# Patient Record
Sex: Female | Born: 1980 | Race: White | Hispanic: No | Marital: Single | Smoking: Current every day smoker
Health system: Southern US, Community
[De-identification: ages and names within clinical notes are randomized; demographics above are authoritative.]

## PROBLEM LIST (undated history)

## (undated) DIAGNOSIS — G709 Myoneural disorder, unspecified: Secondary | ICD-10-CM

## (undated) DIAGNOSIS — F329 Major depressive disorder, single episode, unspecified: Secondary | ICD-10-CM

## (undated) DIAGNOSIS — M1990 Unspecified osteoarthritis, unspecified site: Secondary | ICD-10-CM

## (undated) DIAGNOSIS — R51 Headache: Secondary | ICD-10-CM

## (undated) DIAGNOSIS — J45909 Unspecified asthma, uncomplicated: Secondary | ICD-10-CM

## (undated) DIAGNOSIS — D649 Anemia, unspecified: Secondary | ICD-10-CM

## (undated) DIAGNOSIS — F32A Depression, unspecified: Secondary | ICD-10-CM

## (undated) DIAGNOSIS — F99 Mental disorder, not otherwise specified: Secondary | ICD-10-CM

## (undated) DIAGNOSIS — F319 Bipolar disorder, unspecified: Secondary | ICD-10-CM

## (undated) DIAGNOSIS — K219 Gastro-esophageal reflux disease without esophagitis: Secondary | ICD-10-CM

## (undated) HISTORY — DX: Unspecified osteoarthritis, unspecified site: M19.90

## (undated) HISTORY — DX: Myoneural disorder, unspecified: G70.9

## (undated) HISTORY — DX: Major depressive disorder, single episode, unspecified: F32.9

## (undated) HISTORY — PX: BILATERAL CARPAL TUNNEL RELEASE: SHX6508

## (undated) HISTORY — DX: Depression, unspecified: F32.A

## (undated) HISTORY — PX: TONSILLECTOMY: SHX5217

## (undated) HISTORY — PX: DILATION AND CURETTAGE OF UTERUS: SHX78

## (undated) HISTORY — DX: Gastro-esophageal reflux disease without esophagitis: K21.9

---

## 2004-10-07 ENCOUNTER — Inpatient Hospital Stay: Payer: Self-pay

## 2004-11-04 ENCOUNTER — Ambulatory Visit: Payer: Self-pay | Admitting: Family Medicine

## 2005-02-02 ENCOUNTER — Ambulatory Visit: Payer: Self-pay | Admitting: Unknown Physician Specialty

## 2005-02-22 ENCOUNTER — Ambulatory Visit: Payer: Self-pay | Admitting: Family Medicine

## 2005-05-27 ENCOUNTER — Emergency Department (HOSPITAL_COMMUNITY): Admission: EM | Admit: 2005-05-27 | Discharge: 2005-05-28 | Payer: Self-pay | Admitting: Emergency Medicine

## 2005-06-19 ENCOUNTER — Ambulatory Visit: Payer: Self-pay | Admitting: Pain Medicine

## 2005-06-21 ENCOUNTER — Ambulatory Visit: Payer: Self-pay | Admitting: Family Medicine

## 2005-07-10 ENCOUNTER — Ambulatory Visit: Payer: Self-pay | Admitting: Pain Medicine

## 2005-07-11 ENCOUNTER — Ambulatory Visit: Payer: Self-pay | Admitting: Pain Medicine

## 2005-07-12 ENCOUNTER — Ambulatory Visit: Payer: Self-pay | Admitting: Pain Medicine

## 2005-07-26 ENCOUNTER — Ambulatory Visit: Payer: Self-pay | Admitting: Physician Assistant

## 2005-08-01 ENCOUNTER — Ambulatory Visit: Payer: Self-pay | Admitting: Pain Medicine

## 2005-08-14 ENCOUNTER — Ambulatory Visit: Payer: Self-pay | Admitting: Physician Assistant

## 2005-08-17 ENCOUNTER — Ambulatory Visit: Payer: Self-pay | Admitting: Pain Medicine

## 2005-08-29 ENCOUNTER — Ambulatory Visit: Payer: Self-pay | Admitting: Pain Medicine

## 2005-09-29 ENCOUNTER — Ambulatory Visit: Payer: Self-pay | Admitting: Physician Assistant

## 2005-10-30 ENCOUNTER — Ambulatory Visit: Payer: Self-pay | Admitting: Physician Assistant

## 2005-12-04 ENCOUNTER — Ambulatory Visit: Payer: Self-pay | Admitting: Physician Assistant

## 2005-12-19 ENCOUNTER — Ambulatory Visit: Payer: Self-pay | Admitting: Physician Assistant

## 2005-12-26 ENCOUNTER — Ambulatory Visit: Payer: Self-pay | Admitting: Pain Medicine

## 2006-01-12 ENCOUNTER — Ambulatory Visit: Payer: Self-pay | Admitting: Physician Assistant

## 2006-01-25 ENCOUNTER — Emergency Department: Payer: Self-pay | Admitting: Emergency Medicine

## 2006-01-30 ENCOUNTER — Ambulatory Visit: Payer: Self-pay | Admitting: Family Medicine

## 2006-03-01 ENCOUNTER — Ambulatory Visit: Payer: Self-pay | Admitting: Pain Medicine

## 2006-03-13 ENCOUNTER — Ambulatory Visit: Payer: Self-pay | Admitting: Pain Medicine

## 2006-03-15 ENCOUNTER — Ambulatory Visit: Payer: Self-pay | Admitting: Pain Medicine

## 2006-03-19 ENCOUNTER — Ambulatory Visit: Payer: Self-pay | Admitting: Pain Medicine

## 2006-03-29 ENCOUNTER — Ambulatory Visit: Payer: Self-pay | Admitting: Urology

## 2006-05-21 ENCOUNTER — Ambulatory Visit: Payer: Self-pay | Admitting: Pain Medicine

## 2006-06-01 ENCOUNTER — Ambulatory Visit: Payer: Self-pay | Admitting: Pain Medicine

## 2006-06-15 ENCOUNTER — Ambulatory Visit: Payer: Self-pay | Admitting: Physician Assistant

## 2006-06-27 ENCOUNTER — Ambulatory Visit: Payer: Self-pay | Admitting: Pain Medicine

## 2006-07-13 ENCOUNTER — Ambulatory Visit: Payer: Self-pay | Admitting: Physician Assistant

## 2006-08-09 ENCOUNTER — Ambulatory Visit: Payer: Self-pay

## 2006-10-12 ENCOUNTER — Ambulatory Visit: Payer: Self-pay | Admitting: Physician Assistant

## 2007-02-14 ENCOUNTER — Emergency Department: Payer: Self-pay | Admitting: Emergency Medicine

## 2007-02-17 ENCOUNTER — Emergency Department: Payer: Self-pay | Admitting: Unknown Physician Specialty

## 2007-11-10 ENCOUNTER — Inpatient Hospital Stay: Payer: Self-pay | Admitting: Obstetrics & Gynecology

## 2008-01-25 ENCOUNTER — Emergency Department: Payer: Self-pay | Admitting: Emergency Medicine

## 2008-12-02 ENCOUNTER — Inpatient Hospital Stay: Payer: Self-pay | Admitting: Psychiatry

## 2009-01-27 ENCOUNTER — Ambulatory Visit: Payer: Self-pay

## 2009-02-24 ENCOUNTER — Ambulatory Visit: Payer: Self-pay | Admitting: Family Medicine

## 2009-02-24 LAB — CONVERTED CEMR LAB
Eosinophils Absolute: 0 10*3/uL (ref 0.0–0.7)
Eosinophils Relative: 1 % (ref 0–5)
HCT: 33.7 % — ABNORMAL LOW (ref 36.0–46.0)
Lymphs Abs: 0.6 10*3/uL — ABNORMAL LOW (ref 0.7–4.0)
MCV: 79.1 fL (ref 78.0–100.0)
Monocytes Relative: 5 % (ref 3–12)
Neutrophils Relative %: 78 % — ABNORMAL HIGH (ref 43–77)
RBC: 4.26 M/uL (ref 3.87–5.11)
Rubella: 116.3 intl units/mL — ABNORMAL HIGH
WBC: 3.6 10*3/uL — ABNORMAL LOW (ref 4.0–10.5)

## 2009-02-25 ENCOUNTER — Ambulatory Visit: Payer: Self-pay | Admitting: Obstetrics & Gynecology

## 2009-02-25 ENCOUNTER — Other Ambulatory Visit: Admission: RE | Admit: 2009-02-25 | Discharge: 2009-02-25 | Payer: Self-pay | Admitting: Obstetrics & Gynecology

## 2009-02-25 ENCOUNTER — Encounter: Payer: Self-pay | Admitting: Obstetrics & Gynecology

## 2009-02-26 ENCOUNTER — Ambulatory Visit (HOSPITAL_COMMUNITY): Admission: RE | Admit: 2009-02-26 | Discharge: 2009-02-26 | Payer: Self-pay | Admitting: Family Medicine

## 2009-03-10 ENCOUNTER — Inpatient Hospital Stay (HOSPITAL_COMMUNITY): Admission: AD | Admit: 2009-03-10 | Discharge: 2009-03-11 | Payer: Self-pay | Admitting: Obstetrics & Gynecology

## 2009-03-10 ENCOUNTER — Ambulatory Visit: Payer: Self-pay | Admitting: Physician Assistant

## 2009-03-10 ENCOUNTER — Ambulatory Visit: Payer: Self-pay | Admitting: Obstetrics & Gynecology

## 2009-03-11 ENCOUNTER — Encounter: Payer: Self-pay | Admitting: Family Medicine

## 2009-04-08 ENCOUNTER — Ambulatory Visit: Payer: Self-pay | Admitting: Obstetrics & Gynecology

## 2009-07-03 HISTORY — PX: LAPAROSCOPY: SHX197

## 2009-09-28 ENCOUNTER — Emergency Department: Payer: Self-pay | Admitting: Emergency Medicine

## 2009-11-15 ENCOUNTER — Emergency Department: Payer: Self-pay | Admitting: Emergency Medicine

## 2010-02-20 ENCOUNTER — Emergency Department: Payer: Self-pay | Admitting: Internal Medicine

## 2010-03-08 ENCOUNTER — Emergency Department: Payer: Self-pay | Admitting: Emergency Medicine

## 2010-03-24 ENCOUNTER — Ambulatory Visit: Payer: Self-pay | Admitting: Orthopedic Surgery

## 2010-05-05 ENCOUNTER — Ambulatory Visit: Payer: Self-pay | Admitting: Orthopedic Surgery

## 2010-07-25 ENCOUNTER — Encounter: Payer: Self-pay | Admitting: Obstetrics & Gynecology

## 2010-08-24 ENCOUNTER — Emergency Department: Payer: Self-pay | Admitting: Emergency Medicine

## 2010-10-07 LAB — CBC
MCHC: 34.6 g/dL (ref 30.0–36.0)
RBC: 3.43 MIL/uL — ABNORMAL LOW (ref 3.87–5.11)

## 2010-10-07 LAB — TYPE AND SCREEN
ABO/RH(D): O POS
Antibody Screen: NEGATIVE

## 2010-10-30 IMAGING — US US FETAL NUCHAL TRANS ADDL GEST
1 series · 18 of 28 positions shown · non-contrast
Comparison: none

OBSTETRICAL ULTRASOUND:
 This ultrasound was performed in The [HOSPITAL], and the [facility] report will be stored to [REDACTED] PACS.

[Series 1: us fetal nuchal trans addl gest · 18 of 42 slices shown]
[im 1/42]
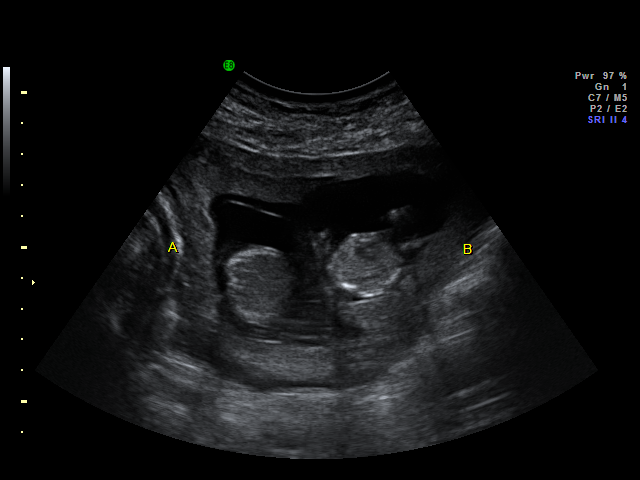
[im 4/42]
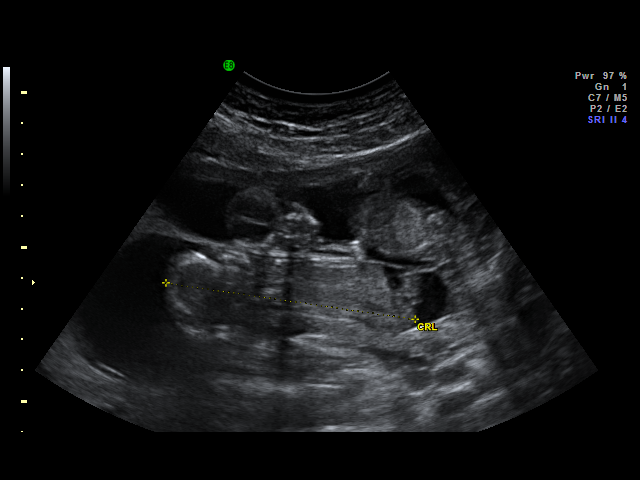
[im 5/42]
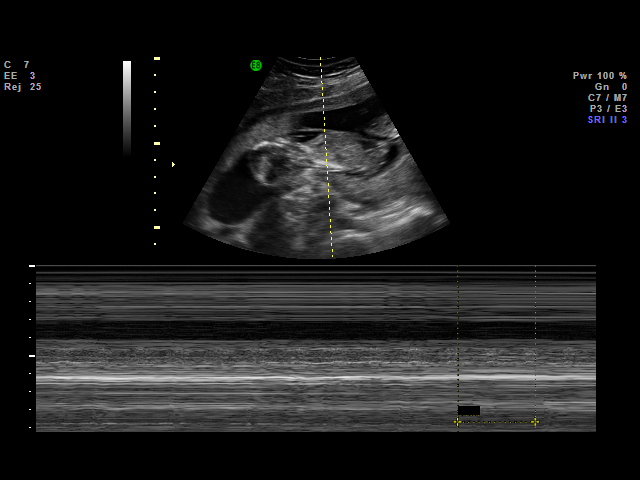
[im 8/42]
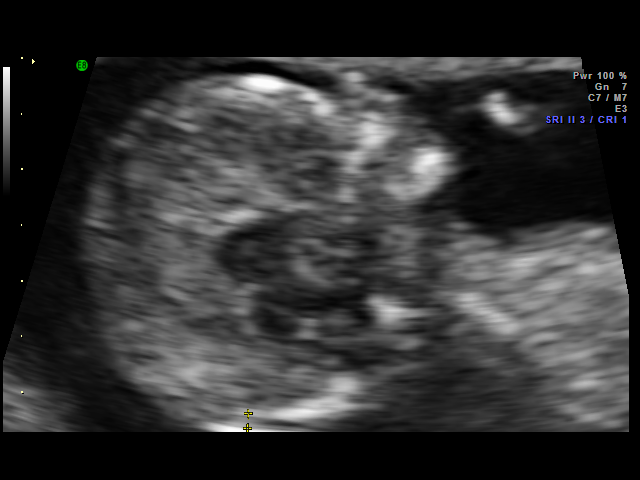
[im 11/42]
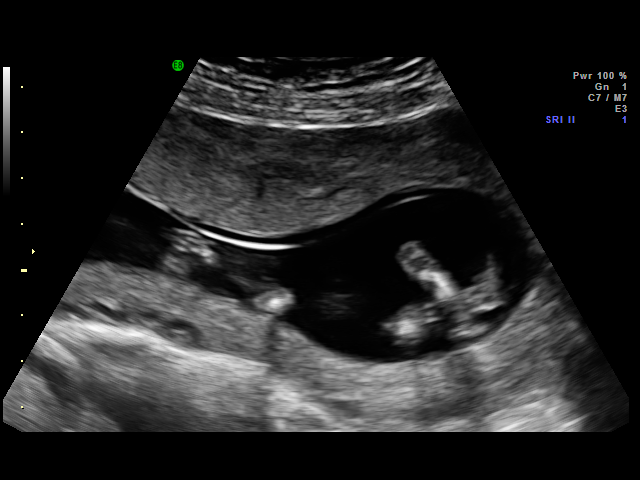
[im 13/42]
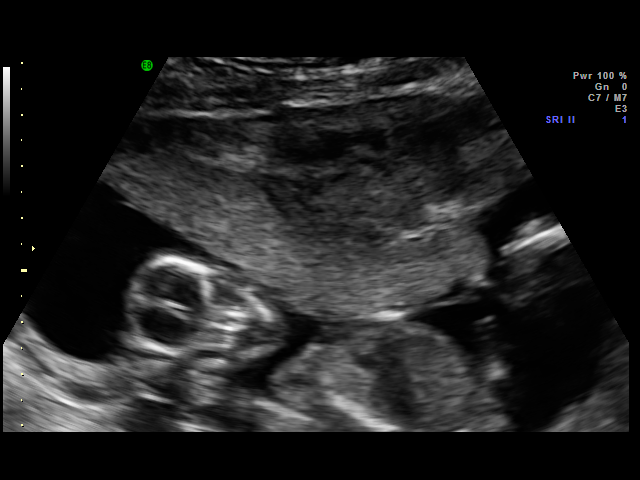
[im 16/42]
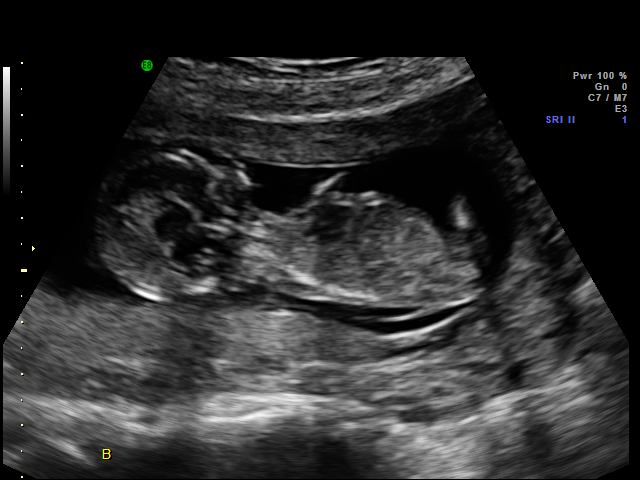
[im 17/42]
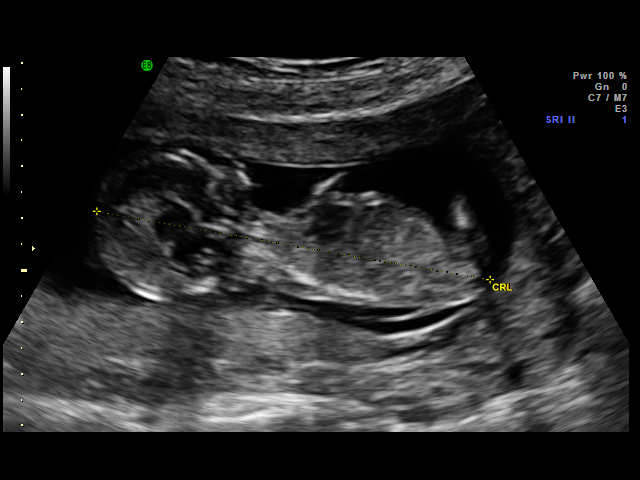
[im 20/42]
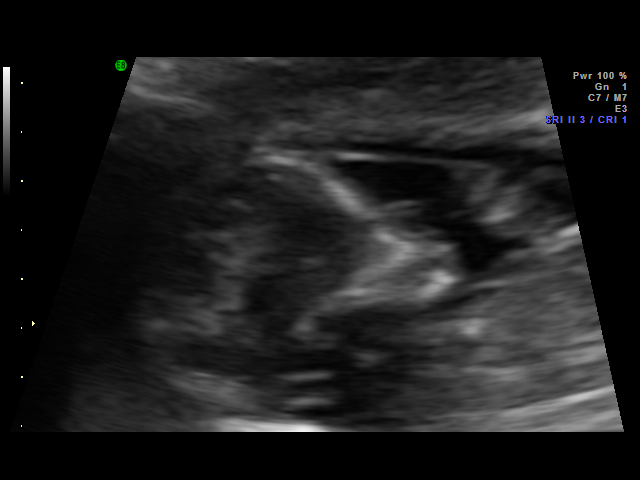
[im 22/42]
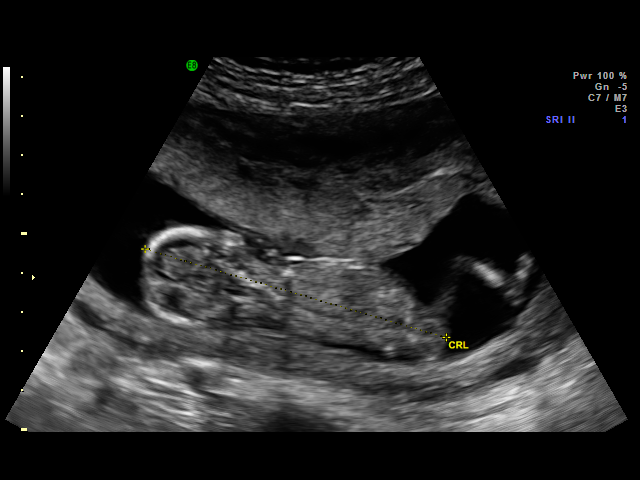
[im 25/42]
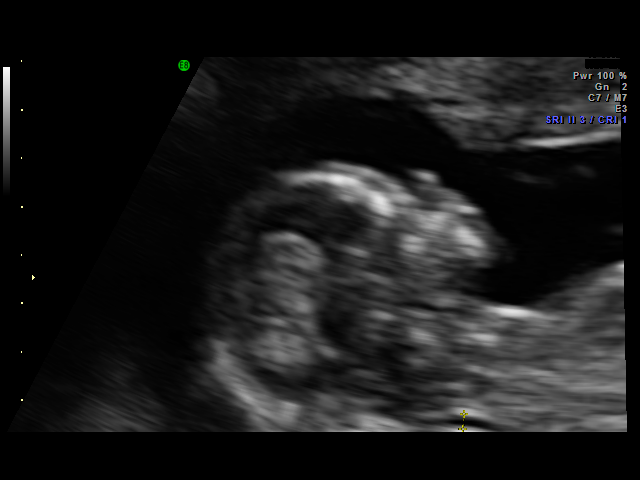
[im 26/42]
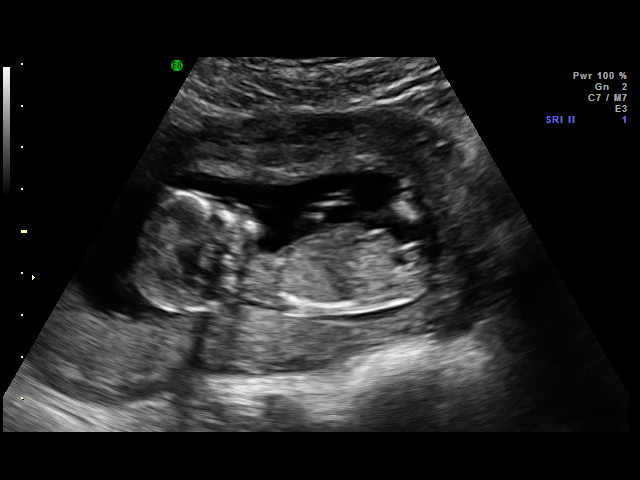
[im 29/42]
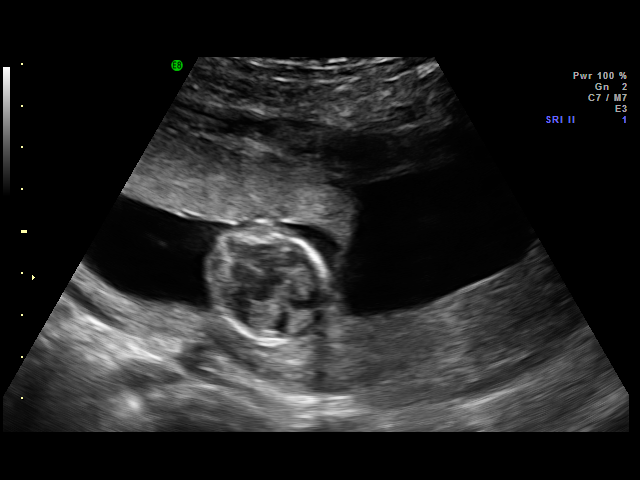
[im 32/42]
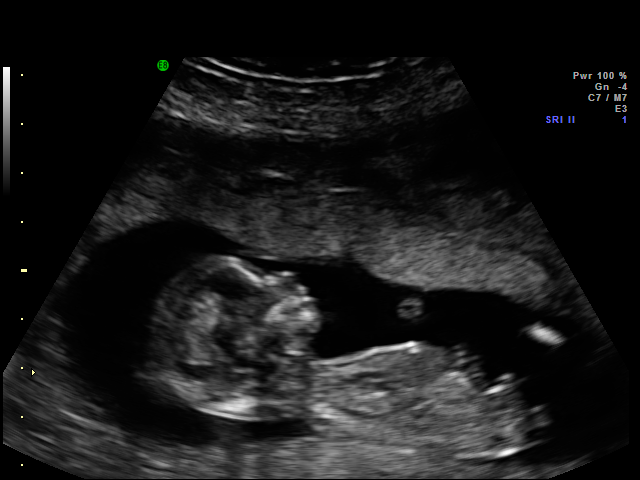
[im 34/42]
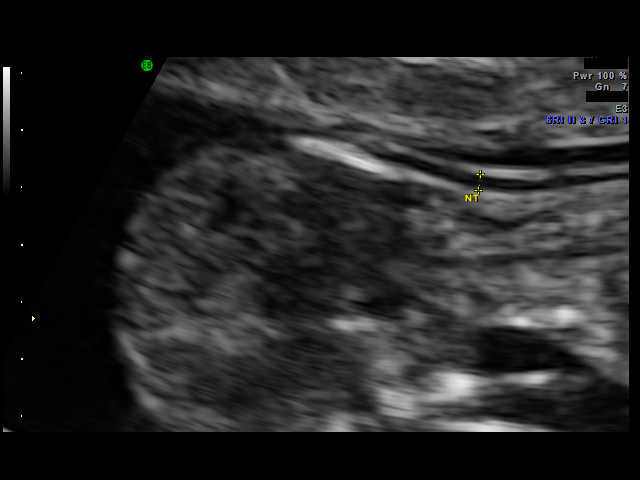
[im 37/42]
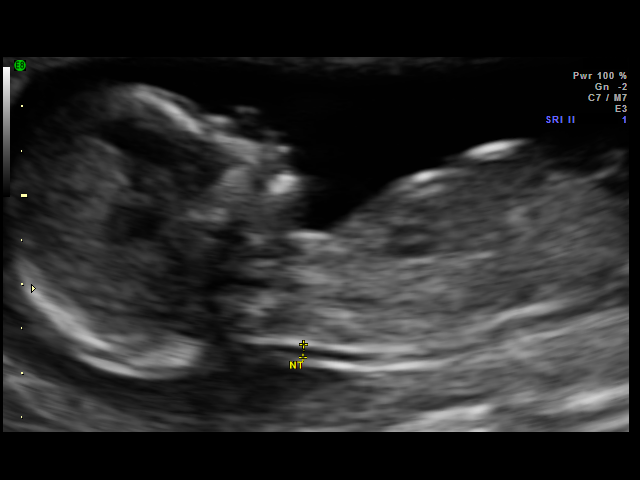
[im 38/42]
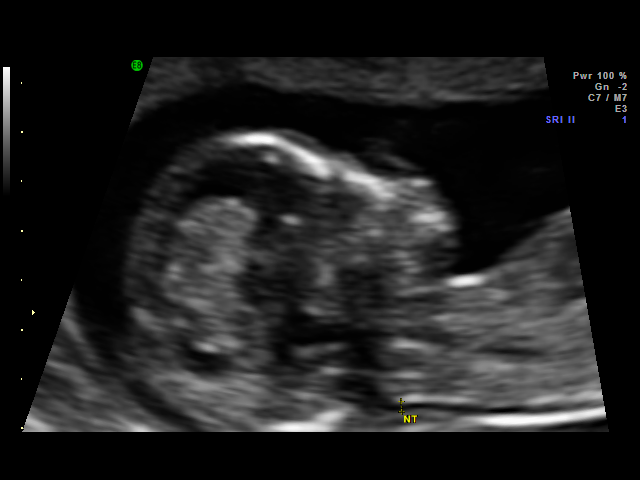
[im 42/42]
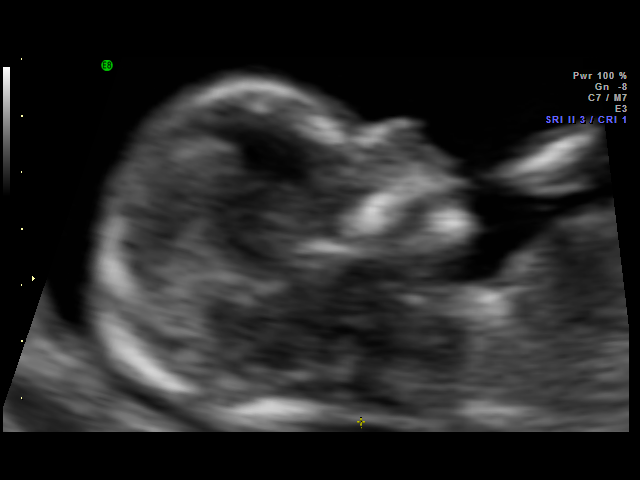

[18 of 28 positions shown; findings below may reference images not displayed]

IMPRESSION: [facility] has also been faxed to the ordering physician.

## 2010-11-15 NOTE — Assessment & Plan Note (Signed)
Crystal [name], Crystal [name]                ACCOUNT NO.:  [id number]   MEDICAL RECORD NO.:  [id number]          PATIENT TYPE:  POB   LOCATION:  [facility] at [facility]         FACILITY:  [facility]   PHYSICIAN:  Scheryl Darter, MD       DATE OF BIRTH:  15-Apr-1981   DATE OF SERVICE:                                  CLINIC NOTE   The patient returns today in followup after a second trimester  miscarriages.  The patient is a 30 year old gravida 4, para 2-0-2-2  presented at 70 weeks 4 days' gestation with twin gestation and gross  rupture of membranes on September 8.  She was admitted for management of  inevitable abortion and she received Cytotec, had spontaneous passage of  twin gestation.  Pathology showed acute chorioamnionitis.  She now just  see some slight irregular bleeding which started a few days ago.  She  would like to be on oral contraceptives for now.  She also has had some  problems with symptoms of depression and insomnia and she has been  treated for anxiety in the past.  Currently, she thinks she does not  need to start any antidepressant medication, but she is having problem  with sleep.  She has done well with Ambien CR in the past.   PAST MEDICAL HISTORY:  Genital warts and abnormal Pap.   PAST SURGICAL HISTORY:  LEEP in 2006 and C-section in 2009 and  diagnostic laparoscopy in 2008.   MEDICATIONS:  She is on no medications currently.   Physical examination; the patient is pleasant and blood pressure is  116/67.  Abdomen is soft, nontender, no mass.  Pelvic exam, external  genitalia showed 1 small condyloma on the right vulva and a very tiny 1  next to it.  Vagina and cervix notable for scant amount of blood.  Cervix appeared normal.  Uterus normal size, nontender.  Treated the 2  small warts with 80% TCA.  She wished to be on oral contraceptives and I  gave prescription for Ortho Tri-Cyclen Lo which she can start at any  time now.  For sleep, I gave her prescription for Ambien CR  12.5 mg 7  tablets 1 p.o. nightly for sleep.  She states that she may see her  primary care doctor for anxiety symptoms.  She will return for routine  care here.      Scheryl Darter, MD     JA/MEDQ  D:  04/08/2009  T:  04/09/2009  Job:  [id number]

## 2010-11-30 ENCOUNTER — Emergency Department: Payer: Self-pay | Admitting: Emergency Medicine

## 2011-04-07 ENCOUNTER — Emergency Department: Payer: Self-pay | Admitting: Internal Medicine

## 2011-04-16 ENCOUNTER — Emergency Department: Payer: Self-pay | Admitting: Emergency Medicine

## 2011-06-14 ENCOUNTER — Emergency Department (HOSPITAL_COMMUNITY)
Admission: EM | Admit: 2011-06-14 | Discharge: 2011-06-14 | Disposition: A | Discharge status: Home / Self Care | Payer: Medicaid Other | Attending: Emergency Medicine | Admitting: Emergency Medicine

## 2011-06-14 DIAGNOSIS — Z760 Encounter for issue of repeat prescription: Secondary | ICD-10-CM | POA: Insufficient documentation

## 2011-06-14 DIAGNOSIS — IMO0001 Reserved for inherently not codable concepts without codable children: Secondary | ICD-10-CM | POA: Insufficient documentation

## 2011-06-14 MED ORDER — METHADONE HCL 10 MG PO TABS
100.0000 mg | ORAL_TABLET | Freq: Once | ORAL | Status: AC
  Administered 2011-06-14: 100 mg via ORAL

## 2011-06-14 NOTE — ED Provider Notes (Addendum)
History     CSN: [id number] Arrival date & time: 06/14/2011 10:44 AM   First MD Initiated Contact with Patient 06/14/11 1132      Chief Complaint  Patient presents with  . Muscle Pain    (Consider location/radiation/quality/duration/timing/severity/associated sxs/prior treatment) HPI Patient w chronic methadone use presents due to an inability to go to her clinic to receive meds today. No past medical history on file.  No past surgical history on file.  No family history on file.  History  Substance Use Topics  . Smoking status: Not on file  . Smokeless tobacco: Not on file  . Alcohol Use: Not on file    OB History    Grav Para Term Preterm Abortions TAB SAB Ect Mult Living                  Review of Systems  All other systems reviewed and are negative.    Allergies  Review of patient's allergies indicates no known allergies.  Home Medications  No current outpatient prescriptions on file.  BP 128/62  Pulse 58  Temp(Src) 97.8 F (36.6 C) (Oral)  Resp 20  Ht 5\' 7"  (1.702 m)  Wt 125 lb (56.7 kg)  BMI 19.58 kg/m2  SpO2 99%  Physical Exam  Constitutional: She is oriented to person, place, and time. She appears well-developed and well-nourished. No distress.  HENT:  Head: Normocephalic and atraumatic.  Eyes: Conjunctivae and EOM are normal.  Cardiovascular: Normal rate and regular rhythm.   Pulmonary/Chest: Effort normal and breath sounds normal. No stridor. No respiratory distress.  Neurological: She is alert and oriented to person, place, and time. No cranial nerve deficit. She exhibits normal muscle tone. Coordination normal.  Skin: Skin is warm and dry.  Psychiatric: She has a normal mood and affect.    ED Course  Procedures (including critical care time)  Labs Reviewed - No data to display No results found.   No diagnosis found.    MDM  Patient w chronic methadone use presents due to an inability to go to her clinic to receive meds  today.  On exam she is no distress, and denies any new complaints.  The patient's dose was confirmed w RN at [facility] .  Patient received her methadone, and was d/c.        Gerhard Munch, MD 06/14/11 1322  Gerhard Munch, MD 09/14/11 [phone]

## 2011-06-14 NOTE — ED Notes (Signed)
Pt states that her ride was late so she missed the [facility] clinic this am states needs 100 mg

## 2011-06-14 NOTE — ED Notes (Signed)
Pt. Needs her methodone

## 2011-06-18 ENCOUNTER — Emergency Department: Payer: Self-pay | Admitting: Unknown Physician Specialty

## 2011-10-27 ENCOUNTER — Inpatient Hospital Stay: Payer: Self-pay | Admitting: Internal Medicine

## 2011-10-27 LAB — URINALYSIS, COMPLETE
Glucose,UR: NEGATIVE mg/dL (ref 0–75)
Ketone: NEGATIVE
Nitrite: POSITIVE
Protein: NEGATIVE
Specific Gravity: 1.009 (ref 1.003–1.030)
Squamous Epithelial: 1

## 2011-10-27 LAB — CBC
HCT: 41.8 % (ref 35.0–47.0)
HGB: 14 g/dL (ref 12.0–16.0)
Platelet: 318 10*3/uL (ref 150–440)
RDW: 12.8 % (ref 11.5–14.5)
WBC: 4.6 10*3/uL (ref 3.6–11.0)

## 2011-10-27 LAB — ACETAMINOPHEN LEVEL: Acetaminophen: <2 ug/mL

## 2011-10-27 LAB — COMPREHENSIVE METABOLIC PANEL
Alkaline Phosphatase: 85 U/L (ref 50–136)
Calcium, Total: 8.7 mg/dL (ref 8.5–10.1)
Chloride: 107 mmol/L (ref 98–107)
Co2: 27 mmol/L (ref 21–32)
Creatinine: 0.71 mg/dL (ref 0.60–1.30)
EGFR (African American): >60
Glucose: 98 mg/dL (ref 65–99)
Osmolality: 285 (ref 275–301)
SGOT(AST): 258 U/L — ABNORMAL HIGH (ref 15–37)
SGPT (ALT): 172 U/L — ABNORMAL HIGH
Total Protein: 7.3 g/dL (ref 6.4–8.2)

## 2011-10-27 LAB — ETHANOL: Ethanol: <3 mg/dL

## 2011-10-27 LAB — DRUG SCREEN, URINE
Amphetamines, Ur Screen: POSITIVE (ref ?–1000)
Barbiturates, Ur Screen: NEGATIVE (ref ?–200)
Phencyclidine (PCP) Ur S: NEGATIVE (ref ?–25)

## 2011-10-27 LAB — SALICYLATE LEVEL: Salicylates, Serum: <1.7 mg/dL

## 2011-10-27 LAB — TROPONIN I: Troponin-I: <0.02 ng/mL

## 2011-10-28 LAB — CBC WITH DIFFERENTIAL/PLATELET
Basophil #: 0 10*3/uL (ref 0.0–0.1)
Eosinophil #: 0 10*3/uL (ref 0.0–0.7)
Eosinophil %: 0 %
HCT: 39.6 % (ref 35.0–47.0)
HGB: 13 g/dL (ref 12.0–16.0)
Lymphocyte #: 0.9 10*3/uL — ABNORMAL LOW (ref 1.0–3.6)
Lymphocyte %: 10.6 %
MCHC: 32.9 g/dL (ref 32.0–36.0)
MCV: 84 fL (ref 80–100)
RDW: 13.1 % (ref 11.5–14.5)

## 2011-10-28 LAB — HEPATIC FUNCTION PANEL A (ARMC)
Alkaline Phosphatase: 76 U/L (ref 50–136)
Bilirubin, Direct: 0.1 mg/dL (ref 0.00–0.20)
Bilirubin,Total: 0.4 mg/dL (ref 0.2–1.0)
Total Protein: 6.8 g/dL (ref 6.4–8.2)

## 2011-10-28 LAB — BASIC METABOLIC PANEL
Anion Gap: 8 (ref 7–16)
Co2: 25 mmol/L (ref 21–32)
EGFR (African American): >60
Glucose: 150 mg/dL — ABNORMAL HIGH (ref 65–99)
Osmolality: 288 (ref 275–301)
Sodium: 143 mmol/L (ref 136–145)

## 2011-10-28 LAB — RAPID HIV-1/2 QL/CONFIRM: HIV-1/2,Rapid Ql: NEGATIVE

## 2011-10-29 LAB — COMPREHENSIVE METABOLIC PANEL
Alkaline Phosphatase: 65 U/L (ref 50–136)
Anion Gap: 4 — ABNORMAL LOW (ref 7–16)
Bilirubin,Total: 0.4 mg/dL (ref 0.2–1.0)
Chloride: 108 mmol/L — ABNORMAL HIGH (ref 98–107)
Co2: 30 mmol/L (ref 21–32)
EGFR (Non-African Amer.): >60
Glucose: 138 mg/dL — ABNORMAL HIGH (ref 65–99)
Osmolality: 285 (ref 275–301)
Potassium: 4.4 mmol/L (ref 3.5–5.1)
SGOT(AST): 59 U/L — ABNORMAL HIGH (ref 15–37)
Sodium: 142 mmol/L (ref 136–145)

## 2011-10-29 LAB — CBC WITH DIFFERENTIAL/PLATELET
Eosinophil %: 0 %
Lymphocyte #: 0.7 10*3/uL — ABNORMAL LOW (ref 1.0–3.6)
Lymphocyte %: 7.7 %
MCH: 27.9 pg (ref 26.0–34.0)
MCHC: 33.4 g/dL (ref 32.0–36.0)
Monocyte #: 0.2 x10 3/mm (ref 0.2–0.9)
Monocyte %: 2.6 %
Neutrophil #: 8.1 10*3/uL — ABNORMAL HIGH (ref 1.4–6.5)
RDW: 13.2 % (ref 11.5–14.5)

## 2011-10-30 LAB — COMPREHENSIVE METABOLIC PANEL
Alkaline Phosphatase: 58 U/L (ref 50–136)
BUN: 13 mg/dL (ref 7–18)
Bilirubin,Total: 0.4 mg/dL (ref 0.2–1.0)
Calcium, Total: 8.2 mg/dL — ABNORMAL LOW (ref 8.5–10.1)
Co2: 25 mmol/L (ref 21–32)
EGFR (African American): >60
EGFR (Non-African Amer.): >60
Glucose: 123 mg/dL — ABNORMAL HIGH (ref 65–99)
Osmolality: 286 (ref 275–301)
SGOT(AST): 35 U/L (ref 15–37)

## 2011-10-30 LAB — POTASSIUM: Potassium: 3.8 mmol/L (ref 3.5–5.1)

## 2011-10-30 LAB — URINE CULTURE

## 2011-11-01 LAB — COMPREHENSIVE METABOLIC PANEL
Albumin: 3.2 g/dL — ABNORMAL LOW (ref 3.4–5.0)
Alkaline Phosphatase: 70 U/L (ref 50–136)
Bilirubin,Total: 0.4 mg/dL (ref 0.2–1.0)
Chloride: 102 mmol/L (ref 98–107)
Creatinine: 0.76 mg/dL (ref 0.60–1.30)
EGFR (African American): >60
EGFR (Non-African Amer.): >60
Glucose: 90 mg/dL (ref 65–99)
Potassium: 3.6 mmol/L (ref 3.5–5.1)
Sodium: 141 mmol/L (ref 136–145)
Total Protein: 6.6 g/dL (ref 6.4–8.2)

## 2011-11-29 ENCOUNTER — Ambulatory Visit: Payer: Self-pay | Admitting: Family Medicine

## 2012-03-11 ENCOUNTER — Ambulatory Visit: Payer: Self-pay | Admitting: Family Medicine

## 2012-03-15 ENCOUNTER — Ambulatory Visit: Payer: Self-pay

## 2012-03-29 ENCOUNTER — Ambulatory Visit: Payer: Self-pay | Admitting: Family Medicine

## 2012-04-15 ENCOUNTER — Ambulatory Visit: Payer: Self-pay | Admitting: Specialist

## 2012-05-13 ENCOUNTER — Emergency Department: Payer: Self-pay | Admitting: Emergency Medicine

## 2012-05-13 LAB — CBC
HCT: 41 % (ref 35.0–47.0)
HGB: 14.1 g/dL (ref 12.0–16.0)
MCHC: 34.4 g/dL (ref 32.0–36.0)
RBC: 4.86 10*6/uL (ref 3.80–5.20)
WBC: 2.6 10*3/uL — ABNORMAL LOW (ref 3.6–11.0)

## 2012-05-13 LAB — COMPREHENSIVE METABOLIC PANEL
Alkaline Phosphatase: 98 U/L (ref 50–136)
BUN: 15 mg/dL (ref 7–18)
Bilirubin,Total: 0.3 mg/dL (ref 0.2–1.0)
EGFR (African American): >60
EGFR (Non-African Amer.): >60
Glucose: 88 mg/dL (ref 65–99)
SGOT(AST): 118 U/L — ABNORMAL HIGH (ref 15–37)
SGPT (ALT): 128 U/L — ABNORMAL HIGH (ref 12–78)

## 2012-05-13 LAB — URINALYSIS, COMPLETE
Blood: NEGATIVE
Glucose,UR: NEGATIVE mg/dL (ref 0–75)
Leukocyte Esterase: NEGATIVE
Nitrite: NEGATIVE

## 2012-05-13 LAB — PREGNANCY, URINE: Pregnancy Test, Urine: NEGATIVE m[IU]/mL

## 2012-08-15 ENCOUNTER — Emergency Department: Payer: Self-pay | Admitting: Emergency Medicine

## 2012-09-10 ENCOUNTER — Emergency Department: Payer: Self-pay | Admitting: Emergency Medicine

## 2012-09-10 LAB — COMPREHENSIVE METABOLIC PANEL
Alkaline Phosphatase: 71 U/L (ref 50–136)
BUN: 12 mg/dL (ref 7–18)
Bilirubin,Total: 0.6 mg/dL (ref 0.2–1.0)
Creatinine: 0.46 mg/dL — ABNORMAL LOW (ref 0.60–1.30)
EGFR (African American): >60
Glucose: 97 mg/dL (ref 65–99)
Osmolality: 272 (ref 275–301)
SGOT(AST): 26 U/L (ref 15–37)
Sodium: 136 mmol/L (ref 136–145)
Total Protein: 7.7 g/dL (ref 6.4–8.2)

## 2012-09-10 LAB — PREGNANCY, URINE: Pregnancy Test, Urine: POSITIVE m[IU]/mL

## 2012-09-10 LAB — CBC
MCH: 29.5 pg (ref 26.0–34.0)
MCV: 87 fL (ref 80–100)
Platelet: 367 10*3/uL (ref 150–440)
RBC: 5.4 10*6/uL — ABNORMAL HIGH (ref 3.80–5.20)
RDW: 13.5 % (ref 11.5–14.5)
WBC: 8.7 10*3/uL (ref 3.6–11.0)

## 2012-09-10 LAB — DRUG SCREEN, URINE
Amphetamines, Ur Screen: NEGATIVE (ref ?–1000)
Cannabinoid 50 Ng, Ur ~~LOC~~: NEGATIVE (ref ?–50)
Cocaine Metabolite,Ur ~~LOC~~: POSITIVE (ref ?–300)
MDMA (Ecstasy)Ur Screen: POSITIVE (ref ?–500)
Opiate, Ur Screen: NEGATIVE (ref ?–300)

## 2012-09-10 LAB — URINALYSIS, COMPLETE
Bilirubin,UR: NEGATIVE
Glucose,UR: NEGATIVE mg/dL (ref 0–75)
Ph: 5 (ref 4.5–8.0)
Protein: NEGATIVE
RBC,UR: 1 /HPF (ref 0–5)
Specific Gravity: 1.023 (ref 1.003–1.030)
Squamous Epithelial: 3
WBC UR: 4 /HPF (ref 0–5)

## 2012-09-10 LAB — ETHANOL: Ethanol %: <0.003 % (ref 0.000–0.080)

## 2012-09-11 ENCOUNTER — Encounter (HOSPITAL_COMMUNITY): Payer: Self-pay | Admitting: *Deleted

## 2012-09-11 ENCOUNTER — Inpatient Hospital Stay (HOSPITAL_COMMUNITY)
Admission: EM | Admit: 2012-09-11 | Discharge: 2012-09-18 | DRG: 897 | Disposition: A | Discharge status: Home / Self Care | Payer: Medicaid Other | Source: Other Acute Inpatient Hospital | Attending: Psychiatry | Admitting: Psychiatry

## 2012-09-11 DIAGNOSIS — F39 Unspecified mood [affective] disorder: Secondary | ICD-10-CM | POA: Diagnosis present

## 2012-09-11 DIAGNOSIS — F1910 Other psychoactive substance abuse, uncomplicated: Secondary | ICD-10-CM | POA: Diagnosis present

## 2012-09-11 DIAGNOSIS — F3177 Bipolar disorder, in partial remission, most recent episode mixed: Secondary | ICD-10-CM | POA: Diagnosis present

## 2012-09-11 DIAGNOSIS — F909 Attention-deficit hyperactivity disorder, unspecified type: Secondary | ICD-10-CM | POA: Diagnosis present

## 2012-09-11 DIAGNOSIS — Z79899 Other long term (current) drug therapy: Secondary | ICD-10-CM

## 2012-09-11 DIAGNOSIS — J45909 Unspecified asthma, uncomplicated: Secondary | ICD-10-CM | POA: Diagnosis present

## 2012-09-11 DIAGNOSIS — F1120 Opioid dependence, uncomplicated: Principal | ICD-10-CM | POA: Diagnosis present

## 2012-09-11 DIAGNOSIS — F1920 Other psychoactive substance dependence, uncomplicated: Secondary | ICD-10-CM | POA: Diagnosis present

## 2012-09-11 HISTORY — DX: Mental disorder, not otherwise specified: F99

## 2012-09-11 HISTORY — DX: Headache: R51

## 2012-09-11 HISTORY — DX: Unspecified asthma, uncomplicated: J45.909

## 2012-09-11 HISTORY — DX: Anemia, unspecified: D64.9

## 2012-09-11 MED ORDER — IBUPROFEN 800 MG PO TABS
800.0000 mg | ORAL_TABLET | Freq: Three times a day (TID) | ORAL | Status: DC | PRN
  Administered 2012-09-12: 800 mg via ORAL
  Filled 2012-09-11: qty 1

## 2012-09-11 MED ORDER — HYDROXYZINE HCL 25 MG PO TABS
25.0000 mg | ORAL_TABLET | Freq: Four times a day (QID) | ORAL | Status: AC | PRN
  Administered 2012-09-12 (×2): 25 mg via ORAL

## 2012-09-11 MED ORDER — LORAZEPAM 1 MG PO TABS
1.0000 mg | ORAL_TABLET | Freq: Two times a day (BID) | ORAL | Status: AC
  Administered 2012-09-11 (×2): 1 mg via ORAL
  Filled 2012-09-11: qty 1

## 2012-09-11 MED ORDER — VITAMIN B-1 100 MG PO TABS
100.0000 mg | ORAL_TABLET | Freq: Every day | ORAL | Status: DC
  Administered 2012-09-12 – 2012-09-18 (×7): 100 mg via ORAL
  Filled 2012-09-11 (×9): qty 1

## 2012-09-11 MED ORDER — MAGNESIUM HYDROXIDE 400 MG/5ML PO SUSP: 30.0000 mL | Freq: Every day | ORAL | Status: DC | PRN

## 2012-09-11 MED ORDER — ARIPIPRAZOLE 15 MG PO TABS
15.0000 mg | ORAL_TABLET | Freq: Every day | ORAL | Status: DC
  Administered 2012-09-11: 15 mg via ORAL
  Filled 2012-09-11 (×3): qty 1

## 2012-09-11 MED ORDER — LORAZEPAM 1 MG PO TABS
1.0000 mg | ORAL_TABLET | Freq: Three times a day (TID) | ORAL | Status: AC
  Administered 2012-09-12 (×3): 1 mg via ORAL
  Filled 2012-09-11 (×5): qty 1

## 2012-09-11 MED ORDER — GABAPENTIN 400 MG PO CAPS
800.0000 mg | ORAL_CAPSULE | Freq: Four times a day (QID) | ORAL | Status: DC
  Administered 2012-09-11 – 2012-09-18 (×27): 800 mg via ORAL
  Filled 2012-09-11 (×38): qty 2

## 2012-09-11 MED ORDER — ACETAMINOPHEN 325 MG PO TABS
650.0000 mg | ORAL_TABLET | Freq: Four times a day (QID) | ORAL | Status: DC | PRN
  Administered 2012-09-12: 650 mg via ORAL

## 2012-09-11 MED ORDER — LOPERAMIDE HCL 2 MG PO CAPS: 2.0000 mg | ORAL_CAPSULE | ORAL | Status: DC | PRN

## 2012-09-11 MED ORDER — LORAZEPAM 1 MG PO TABS
1.0000 mg | ORAL_TABLET | ORAL | Status: AC
  Administered 2012-09-11: 1 mg via ORAL

## 2012-09-11 MED ORDER — TRAZODONE HCL 100 MG PO TABS
200.0000 mg | ORAL_TABLET | Freq: Every day | ORAL | Status: DC
  Administered 2012-09-11: 200 mg via ORAL
  Filled 2012-09-11 (×3): qty 2

## 2012-09-11 MED ORDER — ALUM & MAG HYDROXIDE-SIMETH 200-200-20 MG/5ML PO SUSP
30.0000 mL | ORAL | Status: DC | PRN
  Administered 2012-09-18: 30 mL via ORAL

## 2012-09-11 MED ORDER — VITAMIN B-12 100 MCG PO TABS
50.0000 ug | ORAL_TABLET | Freq: Every day | ORAL | Status: DC
  Administered 2012-09-12: 50 ug via ORAL
  Filled 2012-09-11 (×4): qty 1

## 2012-09-11 MED ORDER — IPRATROPIUM-ALBUTEROL 18-103 MCG/ACT IN AERO
2.0000 | INHALATION_SPRAY | Freq: Four times a day (QID) | RESPIRATORY_TRACT | Status: DC | PRN
  Filled 2012-09-11: qty 14.7

## 2012-09-11 MED ORDER — THIAMINE HCL 100 MG/ML IJ SOLN
100.0000 mg | Freq: Once | INTRAMUSCULAR | Status: AC
  Administered 2012-09-11: 100 mg via INTRAMUSCULAR

## 2012-09-11 MED ORDER — NICOTINE 14 MG/24HR TD PT24
14.0000 mg | MEDICATED_PATCH | Freq: Every day | TRANSDERMAL | Status: DC
  Filled 2012-09-11: qty 1

## 2012-09-11 MED ORDER — ONDANSETRON 4 MG PO TBDP
4.0000 mg | ORAL_TABLET | Freq: Four times a day (QID) | ORAL | Status: DC | PRN
  Administered 2012-09-12 – 2012-09-14 (×3): 4 mg via ORAL

## 2012-09-11 MED ORDER — GABAPENTIN 800 MG PO TABS: 800.0000 mg | ORAL_TABLET | Freq: Four times a day (QID) | ORAL | Status: DC

## 2012-09-11 MED ORDER — NICOTINE 21 MG/24HR TD PT24
21.0000 mg | MEDICATED_PATCH | Freq: Every day | TRANSDERMAL | Status: DC
  Administered 2012-09-11: 21 mg via TRANSDERMAL
  Filled 2012-09-11: qty 1

## 2012-09-11 MED ORDER — LORAZEPAM 1 MG PO TABS
1.0000 mg | ORAL_TABLET | Freq: Every day | ORAL | Status: AC
  Administered 2012-09-14: 1 mg via ORAL
  Filled 2012-09-11: qty 1

## 2012-09-11 MED ORDER — LORAZEPAM 1 MG PO TABS
1.0000 mg | ORAL_TABLET | Freq: Two times a day (BID) | ORAL | Status: AC
  Administered 2012-09-13 (×2): 1 mg via ORAL
  Filled 2012-09-11 (×2): qty 1

## 2012-09-11 MED ORDER — ADULT MULTIVITAMIN W/MINERALS CH
1.0000 | ORAL_TABLET | Freq: Every day | ORAL | Status: DC
  Administered 2012-09-11 – 2012-09-18 (×8): 1 via ORAL
  Filled 2012-09-11 (×10): qty 1

## 2012-09-11 MED ORDER — DULOXETINE HCL 60 MG PO CPEP
120.0000 mg | ORAL_CAPSULE | Freq: Every day | ORAL | Status: DC
  Administered 2012-09-12 – 2012-09-16 (×5): 120 mg via ORAL
  Filled 2012-09-11 (×6): qty 2

## 2012-09-11 NOTE — BH Assessment (Signed)
Assessment Note   Crystal [name] is an 32 y.o. female.presented to [facility] today after she called an ambulance to bring her there for detox. She states she lives alone in a house, she has two children, a 34 yo son who lives with his father and a 59 yo that lives between she and her mother.She is unemployed and lives off of food stamps and and father helps her now and then. She is currenlty using cocain up to t gm a day heroin 2 bags daily and opioids Roxycontin 30mg  2 pills last used on 3/9 and she uses marijuana when she is unable to get anything else.Her alcohol level was negative and her UDS was positive for tricyclic antidepressants, MDMA, cocaine and benzodiazepines. She states she had been sober for 6 months when she was receiving Suboxone thru [facility] and when the Dr. There took her off she relapsed.In the ED she complained of abdominal discomfort and when evaluated she had a positive pregnancy test and with further eval they were unable to find a fetal heartbeat. It was determined that she has a failed intrauterine pregnancy. She was informed of the findings by the Dr and she was reported to say she had had this happen before and didn't seem to be acutely distressed by the news. She is not homicidal or suicidal. She is not psychotic. She has had two previous admissions for detox; one at [facility], and once at [facility].She is on a number of medications.Consulted with Serena Colonel NP and she first spoke with Dr. Dub Mikes before giving the go ahead for admission due to questionable pregnancy. After it was determined that she had infact had a failed pregnancy and she had been counseled on it she was approved for admission to the [facility] for detox under Dr [employer] service.  Axis I: polysubstance dependence Axis II: Deferred Axis III: No past medical history on file. Axis IV: economic problems, occupational problems and other psychosocial or environmental problems Axis V: 21-30 behavior  considerably influenced by delusions or hallucinations OR serious impairment in judgment, communication OR inability to function in almost all areas  Past Medical History: No past medical history on file.  No past surgical history on file.  Family History: No family history on file.  Social History:  reports that she has been smoking.  She has never used smokeless tobacco. She reports that she drinks about 1.8 ounces of alcohol per week. She reports that she uses illicit drugs (Cocaine, Marijuana, Heroin, and Oxycodone).  Additional Social History:  Alcohol / Drug Use Pain Medications: using vicodin and roxycodin Prescriptions: is not prescribed controlled medications Over the Counter: not abusing History of alcohol / drug use?: Yes Longest period of sobriety (when/how long): unknown Substance #1 Name of Substance 1: cocaine 1 - Age of First Use: unknown 1 - Amount (size/oz): 1 gm 1 - Frequency: several times a week 1 - Last Use / Amount: 3/0/2014 Substance #2 Name of Substance 2: marijuana 2 - Age of First Use: unknown 2 - Amount (size/oz): unknown 2 - Frequency: when cant get anything else stronger 2 - Last Use / Amount: 09/07/2011 Substance #3 Name of Substance 3: Heroin 3 - Amount (size/oz): 2 bags a day 3 - Frequency: daily 3 - Last Use / Amount: 09/06/2012 Substance #4 Name of Substance 4: opiates, Roxycodone, Vicodin 4 - Age of First Use: unknown 4 - Amount (size/oz): Vicodin 4- 10 mg pills  CIWA:   COWS:  Allergies:  Allergies  Allergen Reactions  . Buspar (Buspirone)     Unsteady gait  . Lexapro (Escitalopram Oxalate) Diarrhea  . Nubain (Nalbuphine Hcl)     restless  . Tylenol (Acetaminophen) Diarrhea  . Vicodin (Hydrocodone-Acetaminophen)     GI distress  . Zanaflex (Tizanidine Hcl)     Altered mental status    Home Medications:  (Not in a hospital admission)  OB/GYN Status:  Patient's last menstrual period was 07/16/2012.  General Assessment  Data Location of Assessment: [facility] Assessment Services Living Arrangements: Children (4 yo lives between her and her mother) Can pt return to current living arrangement?: Yes Admission Status: Involuntary Is patient capable of signing voluntary admission?: No Transfer from: Acute Hospital Referral Source: MD  Education Status Is patient currently in school?: No  Risk to self Suicidal Ideation: No Suicidal Intent: No Is patient at risk for suicide?: Yes Suicidal Plan?: No Access to Means: No What has been your use of drugs/alcohol within the last 12 months?: daily use of etoh and cocaine heroin marijuana and opiates Previous Attempts/Gestures: No Other Self Harm Risks: drug use Intentional Self Injurious Behavior: None Family Suicide History: Unknown Recent stressful life event(s): Financial Problems Persecutory voices/beliefs?: No Depression: No Substance abuse history and/or treatment for substance abuse?: Yes Suicide prevention information given to non-admitted patients: Not applicable  Risk to Others Homicidal Ideation: No Thoughts of Harm to Others: No Current Homicidal Intent: No Current Homicidal Plan: No Access to Homicidal Means: No History of harm to others?: No Assessment of Violence: None Noted Does patient have access to weapons?: No Criminal Charges Pending?: No Does patient have a court date: No  Psychosis Hallucinations: None noted Delusions: None noted  Mental Status Report Appear/Hygiene: Disheveled (poor hygiene) Eye Contact: Fair Motor Activity: Freedom of movement Speech: Logical/coherent Level of Consciousness: Alert Mood: Anxious Affect: Anxious Anxiety Level: Moderate Thought Processes: Coherent;Relevant Judgement: Impaired Orientation: Person;Place;Time;Situation Obsessive Compulsive Thoughts/Behaviors: None  Cognitive Functioning Concentration: Decreased Memory: Recent Intact;Remote Intact IQ: Average Insight: Fair Impulse  Control: Fair Appetite: Fair Weight Loss:  (not assessed) Weight Gain:  (not assessed) Sleep: Decreased Total Hours of Sleep: 7 Vegetative Symptoms: None  ADLScreening [facility] Assessment Services) Patient's cognitive ability adequate to safely complete daily activities?: Yes Patient able to express need for assistance with ADLs?: Yes Independently performs ADLs?: Yes (appropriate for developmental age)  Abuse/Neglect [facility]) Physical Abuse: Denies Verbal Abuse: Denies Sexual Abuse: Denies  Prior Inpatient Therapy Prior Inpatient Therapy: Yes Prior Therapy Dates: 01/2012 and 02/2012 Prior Therapy Facilty/Provider(s): [facility] and [facility] Reason for Treatment: substance dependence  Prior Outpatient Therapy Prior Outpatient Therapy: Yes Prior Therapy Dates: current Prior Therapy Facilty/Provider(s): [facility] Reason for Treatment: substance use, was taking Suboxone 6 mo ago  ADL Screening (condition at time of admission) Patient's cognitive ability adequate to safely complete daily activities?: Yes Patient able to express need for assistance with ADLs?: Yes Independently performs ADLs?: Yes (appropriate for developmental age) Weakness of Legs: None Weakness of Arms/Hands: None  Home Assistive Devices/Equipment Home Assistive Devices/Equipment: None    Abuse/Neglect Assessment (Assessment to be complete while patient is alone) Physical Abuse: Denies Verbal Abuse: Denies Sexual Abuse: Denies Exploitation of patient/patient's resources: Denies Self-Neglect: Denies Values / Beliefs Cultural Requests During Hospitalization: None Spiritual Requests During Hospitalization: None   Advance Directives (For Healthcare) Advance Directive: Patient does not have advance directive Pre-existing out of facility DNR order (yellow form or pink MOST form): No Nutrition Screen- [facility] Adult/WL/AP Patient's home diet: Regular Have  you recently lost weight without trying?: No Have you been eating  poorly because of a decreased appetite?: No Malnutrition Screening Tool Score: 0  Additional Information 1:1 In Past 12 Months?: No CIRT Risk: No Elopement Risk: No Does patient have medical clearance?: Yes     Disposition:  Disposition Initial Assessment Completed: Yes Disposition of Patient: Inpatient treatment program Type of inpatient treatment program: Adult  On Site Evaluation by:   Reviewed with Physician:     Wynona Luna 09/11/2012 4:08 PM

## 2012-09-11 NOTE — Progress Notes (Signed)
Patient is a 32y/o s/w/f admitted voluntarily from [facility] requesting detox from etoh,benzos.opiates(heroin and pills),and cocaine.  Had one period of clean time for 6 months following long term treatment, relapsing after stopping suboxone therapy.  Denies SI.  States she has hit her bottom and desires long term treatment. 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  Confirmed positive pregnancy test but was told at [facility] that it was not viable.  Is unemployed and lives off food stamps and family support.  Presents anxious and c/o withdrawal symptoms. Oriented to unit. POC and 15' checks initiated for safety.

## 2012-09-11 NOTE — Tx Team (Signed)
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     ETOH abuse 09/11/12     Social/financial issues 09/11/12                                    DISCHARGE CRITERIA:  Ability to meet basic life and health needs Adequate post-discharge living arrangements Improved stabilization in mood, thinking, and/or behavior Motivation to continue treatment in a less acute level of care Need for constant or close observation no longer present Reduction of life-threatening or endangering symptoms to within safe limits Safe-care adequate arrangements made Verbal commitment to aftercare and medication compliance Withdrawal symptoms are absent or subacute and managed without 24-hour nursing intervention  PRELIMINARY DISCHARGE PLAN: Attend 12-step recovery group Outpatient therapy Return to previous living arrangement  PATIENT/FAMIILY INVOLVEMENT: This treatment plan has been presented to and reviewed with the patient, Crystal [name], and/or family member.  The patient and family have been given the opportunity to ask questions and make suggestions.  Cresenciano Lick 09/11/2012, 5:36 PM

## 2012-09-12 ENCOUNTER — Encounter (HOSPITAL_COMMUNITY): Payer: Self-pay | Admitting: Psychiatry

## 2012-09-12 DIAGNOSIS — F39 Unspecified mood [affective] disorder: Secondary | ICD-10-CM

## 2012-09-12 DIAGNOSIS — F1920 Other psychoactive substance dependence, uncomplicated: Secondary | ICD-10-CM | POA: Diagnosis present

## 2012-09-12 LAB — HCG, QUANTITATIVE, PREGNANCY: hCG, Beta Chain, Quant, S: 64106 m[IU]/mL — ABNORMAL HIGH (ref <5)

## 2012-09-12 MED ORDER — CYANOCOBALAMIN 1000 MCG/ML IJ SOLN
1000.0000 ug | Freq: Once | INTRAMUSCULAR | Status: AC
  Administered 2012-09-12: 1000 ug via INTRAMUSCULAR
  Filled 2012-09-12: qty 1

## 2012-09-12 MED ORDER — LIDOCAINE 5 % EX PTCH
1.0000 | MEDICATED_PATCH | CUTANEOUS | Status: DC
  Administered 2012-09-12 – 2012-09-17 (×6): 1 via TRANSDERMAL
  Filled 2012-09-12 (×9): qty 1

## 2012-09-12 MED ORDER — NICOTINE 21 MG/24HR TD PT24
21.0000 mg | MEDICATED_PATCH | Freq: Every day | TRANSDERMAL | Status: DC
  Administered 2012-09-12 – 2012-09-18 (×7): 21 mg via TRANSDERMAL
  Filled 2012-09-12 (×9): qty 1

## 2012-09-12 MED ORDER — CARBAMAZEPINE ER 200 MG PO CP12
200.0000 mg | ORAL_CAPSULE | Freq: Two times a day (BID) | ORAL | Status: DC
  Administered 2012-09-12 – 2012-09-18 (×13): 200 mg via ORAL
  Filled 2012-09-12 (×17): qty 1

## 2012-09-12 MED ORDER — CLONIDINE HCL 0.1 MG PO TABS
0.1000 mg | ORAL_TABLET | ORAL | Status: AC
  Administered 2012-09-14 – 2012-09-15 (×4): 0.1 mg via ORAL
  Filled 2012-09-12 (×4): qty 1

## 2012-09-12 MED ORDER — NAPROXEN 500 MG PO TABS
500.0000 mg | ORAL_TABLET | Freq: Two times a day (BID) | ORAL | Status: AC | PRN
  Administered 2012-09-12 – 2012-09-15 (×6): 500 mg via ORAL
  Filled 2012-09-12 (×6): qty 1

## 2012-09-12 MED ORDER — PANTOPRAZOLE SODIUM 40 MG PO TBEC
40.0000 mg | DELAYED_RELEASE_TABLET | Freq: Every day | ORAL | Status: DC
  Administered 2012-09-12 – 2012-09-18 (×7): 40 mg via ORAL
  Filled 2012-09-12 (×9): qty 1

## 2012-09-12 MED ORDER — DICYCLOMINE HCL 20 MG PO TABS
20.0000 mg | ORAL_TABLET | Freq: Four times a day (QID) | ORAL | Status: AC | PRN
  Administered 2012-09-15 – 2012-09-17 (×4): 20 mg via ORAL
  Filled 2012-09-12 (×4): qty 1

## 2012-09-12 MED ORDER — CLONIDINE HCL 0.1 MG PO TABS
0.1000 mg | ORAL_TABLET | Freq: Four times a day (QID) | ORAL | Status: AC
  Administered 2012-09-12 – 2012-09-13 (×8): 0.1 mg via ORAL
  Filled 2012-09-12 (×8): qty 1

## 2012-09-12 MED ORDER — CLONIDINE HCL 0.1 MG PO TABS
0.1000 mg | ORAL_TABLET | Freq: Every day | ORAL | Status: AC
  Administered 2012-09-16 – 2012-09-17 (×2): 0.1 mg via ORAL
  Filled 2012-09-12 (×2): qty 1

## 2012-09-12 MED ORDER — METHOCARBAMOL 500 MG PO TABS
500.0000 mg | ORAL_TABLET | Freq: Three times a day (TID) | ORAL | Status: AC | PRN
  Administered 2012-09-12 – 2012-09-15 (×5): 500 mg via ORAL
  Filled 2012-09-12 (×6): qty 1

## 2012-09-12 MED ORDER — OLANZAPINE 10 MG PO TBDP
10.0000 mg | ORAL_TABLET | Freq: Every day | ORAL | Status: DC
  Administered 2012-09-12 – 2012-09-13 (×2): 10 mg via ORAL
  Filled 2012-09-12 (×5): qty 1

## 2012-09-12 NOTE — [facility] Suicide Risk Assessment (Signed)
Suicide Risk Assessment  Admission Assessment     Nursing information obtained from:    Demographic factors:    Current Mental Status:    Loss Factors:    Historical Factors:    Risk Reduction Factors:     CLINICAL FACTORS:   Severe Anxiety and/or Agitation Depression:   Comorbid alcohol abuse/dependence Insomnia Alcohol/Substance Abuse/Dependencies  COGNITIVE FEATURES THAT CONTRIBUTE TO RISK:  Closed-mindedness Thought constriction (tunnel vision)    SUICIDE RISK:   Moderate:  Frequent suicidal ideation with limited intensity, and duration, some specificity in terms of plans, no associated intent, good self-control, limited dysphoria/symptomatology, some risk factors present, and identifiable protective factors, including available and accessible social support.  PLAN OF CARE: Supportive approach/coping skills/relapse prevention                               Clonidine Detox                               Reassess co morbdities  I certify that inpatient services furnished can reasonably be expected to improve the patient's condition.  LUGO,IRVING A 09/12/2012, 4:44 PM

## 2012-09-12 NOTE — Progress Notes (Signed)
[facility] LCSW Group Therapy  09/12/2012 5:58 PM  Type of Therapy:  Group Therapy  Participation Level:  Active  Participation Quality:  Appropriate and Attentive  Affect:  Depressed  Cognitive:  Alert and Appropriate  Insight:  Developing/Improving  Engagement in Therapy:  Engaged  Modes of Intervention: Discussion, Education, Problem-solving, Rapport Building and Support   Summary of Progress/Problems: Pt attended psychoeducation group. Today's theme was "Living a Balanced Life." Pts processed what balance means to them, what ways help up find balance, and what things make [place] lose balance. Pts also took turns discussing things that they would like to let go of and things they would like to hold on to in order to maintain balance in their lives.   Pt was unable to think of things that she could hold on to or let go of to help create a more balanced life. Pt disclosed that she lacks self worth.  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  She stated that she has fallen so far from that place that she does not know where to start.  SW challenged pt to make a goal to begin to take small steps to get back to that place. Pt processed that she has been prideful and but recognizes that starting small may be necessary.  She was able to process that her current actions will keep her in the same place in her life.  Bournes, Roshelle L 09/12/2012, 5:58 PM

## 2012-09-12 NOTE — Progress Notes (Signed)
[facility] LCSW Aftercare Discharge Planning Group Note  09/12/2012 12:57 PM  Participation Quality:  Appropriate and Attentive  Affect:  Anxious and Irritable  Cognitive:  Alert and Appropriate  Insight:  Developing/Improving and Limited  Engagement in Group:  Developing/Improving  Modes of Intervention:  Education, Problem-solving, Rapport Building and Support  Summary of Progress/Problems: Pt attended group. Her affect was anxious and irritable. Pt reports that she is irritable this morning and is not sure why.  Pt rates depression at seven, anxiety at 9, hopelessness and helplessness at six.  Bournes, Roshelle L 09/12/2012, 12:57 PM

## 2012-09-12 NOTE — Progress Notes (Signed)
Recreation Therapy Notes  Date: 03.13.2014 Time: 3:00pm Location: [address] Day Room       Group Topic/Focus: Leisure Education  Participation Level: Did not attend  Jaslene Marsteller L Deondray Ospina, LRT/CTRS   Oluwadarasimi Redmon L 09/12/2012 4:13 PM 

## 2012-09-12 NOTE — Progress Notes (Signed)
Adult Psychoeducational Group Note  Date:  09/12/2012 Time:  7:24 PM  Group Topic/Focus:  Overcoming Stress:   The focus of this group is to define stress and help patients assess their triggers.  Participation Level:  Active  Participation Quality:  Appropriate  Affect:  Appropriate  Cognitive:  Appropriate  Insight: Appropriate  Engagement in Group:  Engaged  Modes of Intervention:  Discussion, Education and Support  Additional Comments:  Pt actively participated in group discussion on stress, including triggers for stress, physiological signs that one is stressed, and positive coping skills for dealing with stress. Pt shared numerous coping skills he uses in order to cope with feelings of stress.    Reinaldo Raddle K 09/12/2012, 7:24 PM

## 2012-09-12 NOTE — Progress Notes (Signed)
D: Pt in bed resting with eyes closed. Respirations even and unlabored. Pt appears to be in no signs of distress at this time. A: Q15min checks remains for this pt. R: Pt remains safe at this time.   
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
## 2012-09-12 NOTE — Progress Notes (Signed)
Patient ID: Crystal [name], female   DOB: 01/05/1981, 32 y.o.   MRN: [id number] She has been up and to parts of groups interacting with peers and staff. She has requested and received PRN medication today for back pain that was helpful. She has voiced concern about  Her pregnancy (See the new lab orders to be drawn in AM).

## 2012-09-13 DIAGNOSIS — F909 Attention-deficit hyperactivity disorder, unspecified type: Secondary | ICD-10-CM

## 2012-09-13 LAB — HCG, SERUM, QUALITATIVE: Preg, Serum: POSITIVE — AB

## 2012-09-13 MED ORDER — KETOROLAC TROMETHAMINE 30 MG/ML IJ SOLN
30.0000 mg | Freq: Once | INTRAMUSCULAR | Status: AC
  Administered 2012-09-13: 30 mg via INTRAMUSCULAR
  Filled 2012-09-13 (×2): qty 1

## 2012-09-13 MED ORDER — VITAMIN B-12 100 MCG PO TABS
100.0000 ug | ORAL_TABLET | Freq: Every day | ORAL | Status: DC
  Administered 2012-09-13 – 2012-09-18 (×6): 100 ug via ORAL
  Filled 2012-09-13 (×8): qty 1

## 2012-09-13 NOTE — Progress Notes (Signed)
[facility] Group Notes:  (Nursing/MHT/Case Management/Adjunct)  Date:  09/13/2012  Time:  12:44 PM  Type of Therapy:  Psychoeducational Skills  Participation Level:  Active  Participation Quality:  Appropriate, Attentive and Sharing  Affect:  Appropriate  Cognitive:  Alert and Appropriate  Insight:  Appropriate and Improving  Engagement in Group:  Engaged  Modes of Intervention:  Activity and Socialization  Summary of Progress/Problems: Pt was engaged in Therapeutic Activity where pts participated in a game called "Would you rather". Pt appeared to understand the concept of the group.  Crystal [name] 09/13/2012, 12:44 PM

## 2012-09-13 NOTE — Progress Notes (Signed)
[facility] MD Progress Note  09/13/2012 5:33 PM Crystal [name]  MRN:  [id number] Subjective:  Crystal [name] complains of pain. Sates that the pain keeps her upset. She did not sleep too well last night. She continues to "obsess" about the "dead baby" inside. She admits she is less restless that yesterday. But still admits to feeling very anxious, worried, concerned. She states that she wants to get her life back together. Not sure what does she need to do from here to be able to maintain abstinence. She is wanting to be present in her daughter's life. She still endorses suicidal ideas on and off. Still frustrated, angry with herself for allowing herself to get to this point, Reports some sedation coming from the [employer] Diagnosis:  Opioid Dependence, Polysubstance Dependence, ADHD, Mood Disorder NOS  ADL's:  Intact  Sleep: Fair  Appetite:  Fair  Suicidal Ideation:  Plan:  ideas on and off Intent:  denies  Means:  denies Homicidal Ideation:  Plan:  denies Intent:  denies Means:  denies AEB (as evidenced by):  Psychiatric Specialty Exam: Review of Systems  Constitutional: Negative.   HENT: Negative.   Eyes: Negative.   Respiratory: Negative.   Gastrointestinal: Negative.   Genitourinary: Negative.   Musculoskeletal: Positive for back pain.  Skin: Negative.   Neurological: Negative.   Endo/Heme/Allergies: Negative.   Psychiatric/Behavioral: Positive for depression and substance abuse. The patient is nervous/anxious and has insomnia.     Blood pressure 104/70, pulse 113, temperature 98.9 F (37.2 C), temperature source Oral, resp. rate 16, height 5\' 6"  (1.676 m), weight 58.06 kg (128 lb), last menstrual period 07/16/2012.Body mass index is 20.67 kg/(m^2).  General Appearance: Fairly Groomed  Patent attorney::  Fair  Speech:  Clear and Coherent and rapid  Volume:  Normal  Mood:  Anxious, Depressed, Hopeless and worried  Affect:  anxious  Thought Process:  Coherent and Goal Directed   Orientation:  Full (Time, Place, and Person)  Thought Content:  Rumination and worries, concerns, fears  Suicidal Thoughts:  Yes.  without intent/plan  Homicidal Thoughts:  No  Memory:  Immediate;   Fair Recent;   Fair Remote;   Fair  Judgement:  Fair  Insight:  superficial  Psychomotor Activity:  Restlessness  Concentration:  Fair  Recall:  Fair  Akathisia:  No  Handed:  Right  AIMS (if indicated):     Assets:  Desire for Improvement  Sleep:  Number of Hours: 6.25   Current Medications: Current Facility-Administered Medications  Medication Dose Route Frequency Provider Last Rate Last Dose  . acetaminophen (TYLENOL) tablet 650 mg  650 mg Oral Q6H PRN Rachael Fee, MD   650 mg at 09/12/12 0351  . albuterol-ipratropium (COMBIVENT) inhaler 2 puff  2 puff Inhalation Q6H PRN Kerry Hough, PA-C      . alum & mag hydroxide-simeth (MAALOX/MYLANTA) 200-200-20 MG/5ML suspension 30 mL  30 mL Oral Q4H PRN Rachael Fee, MD      . carbamazepine (Antipsychotic - EQUETRO) 12 hr capsule 200 mg  200 mg Oral BID Rachael Fee, MD   200 mg at 09/13/12 1706  . cloNIDine (CATAPRES) tablet 0.1 mg  0.1 mg Oral QID Rachael Fee, MD   0.1 mg at 09/13/12 1706   Followed by  . [START ON 09/14/2012] cloNIDine (CATAPRES) tablet 0.1 mg  0.1 mg Oral BH-qamhs Rachael Fee, MD       Followed by  . [START ON 09/16/2012] cloNIDine (CATAPRES) tablet 0.1 mg  0.1 mg Oral QAC breakfast Rachael Fee, MD      . dicyclomine (BENTYL) tablet 20 mg  20 mg Oral Q6H PRN Rachael Fee, MD      . DULoxetine (CYMBALTA) DR capsule 120 mg  120 mg Oral Daily Kerry Hough, PA-C   120 mg at 09/13/12 1610  . gabapentin (NEURONTIN) capsule 800 mg  800 mg Oral QID Kerry Hough, PA-C   800 mg at 09/13/12 1706  . hydrOXYzine (ATARAX/VISTARIL) tablet 25 mg  25 mg Oral Q6H PRN Rachael Fee, MD   25 mg at 09/12/12 2137  . lidocaine (LIDODERM) 5 % 1 patch  1 patch Transdermal Q24H Rachael Fee, MD   1 patch at 09/13/12 1706  .  loperamide (IMODIUM) capsule 2-4 mg  2-4 mg Oral PRN Rachael Fee, MD      . Melene Muller ON 09/14/2012] LORazepam (ATIVAN) tablet 1 mg  1 mg Oral Daily Rachael Fee, MD      . methocarbamol (ROBAXIN) tablet 500 mg  500 mg Oral Q8H PRN Rachael Fee, MD   500 mg at 09/12/12 2137  . multivitamin with minerals tablet 1 tablet  1 tablet Oral Daily Rachael Fee, MD   1 tablet at 09/13/12 9604  . naproxen (NAPROSYN) tablet 500 mg  500 mg Oral BID PRN Rachael Fee, MD   500 mg at 09/13/12 0810  . nicotine (NICODERM CQ - dosed in mg/24 hours) patch 21 mg  21 mg Transdermal Daily Rachael Fee, MD   21 mg at 09/13/12 0615  . OLANZapine zydis (ZYPREXA) disintegrating tablet 10 mg  10 mg Oral QHS Rachael Fee, MD   10 mg at 09/12/12 2137  . ondansetron (ZOFRAN-ODT) disintegrating tablet 4 mg  4 mg Oral Q6H PRN Rachael Fee, MD   4 mg at 09/12/12 0351  . pantoprazole (PROTONIX) EC tablet 40 mg  40 mg Oral Daily Rachael Fee, MD   40 mg at 09/13/12 5409  . thiamine (VITAMIN B-1) tablet 100 mg  100 mg Oral Daily Rachael Fee, MD   100 mg at 09/13/12 0806  . vitamin B-12 (CYANOCOBALAMIN) tablet 100 mcg  100 mcg Oral Daily Rachael Fee, MD   100 mcg at 09/13/12 8119    Lab Results:  Results for orders placed during the hospital encounter of 09/11/12 (from the past 48 hour(s))  HCG, QUANTITATIVE, PREGNANCY     Status: Abnormal   Collection Time    09/12/12  6:20 AM      Result Value Range   hCG, Beta Chain, Quant, S 64106 (*) <5 mIU/mL   Comment:              GEST. AGE      CONC.  (mIU/mL)       <=1 WEEK        5 - 50         2 WEEKS       50 - 500         3 WEEKS       100 - 10,000         4 WEEKS     1,000 - 30,000         5 WEEKS     3,500 - 115,000       6-8 WEEKS     12,000 - 270,000        12 WEEKS  15,000 - 220,000                FEMALE AND NON-PREGNANT FEMALE:         LESS THAN 5 mIU/mL    Physical Findings: AIMS: Facial and Oral Movements Muscles of Facial Expression: None,  normal Lips and Perioral Area: None, normal Jaw: None, normal Tongue: None, normal,Extremity Movements Upper (arms, wrists, hands, fingers): None, normal Lower (legs, knees, ankles, toes): None, normal, Trunk Movements Neck, shoulders, hips: None, normal, Overall Severity Severity of abnormal movements (highest score from questions above): None, normal Incapacitation due to abnormal movements: None, normal Patient's awareness of abnormal movements (rate only patient's report): No Awareness, Dental Status Current problems with teeth and/or dentures?: No Does patient usually wear dentures?: No  CIWA:  CIWA-Ar Total: 1 COWS:  COWS Total Score: 12  Treatment Plan Summary: Daily contact with patient to assess and evaluate symptoms and progress in treatment Medication management  Plan: Supportive approach/coping skills/relapse prevention           Continue to address the co morbidities           Toradol 30 mg IM (once, on a trial basis)            Lidoderm patch to back  Medical Decision Making Problem Points:  Review of last therapy session (1) and Review of psycho-social stressors (1) Data Points:  Review of medication regiment & side effects (2) Review of new medications or change in dosage (2)  I certify that inpatient services furnished can reasonably be expected to improve the patient's condition.   Abraham Entwistle A 09/13/2012, 5:33 PM

## 2012-09-13 NOTE — Progress Notes (Signed)
[facility] LCSW Group Therapy  09/13/2012 1:15 PM Type of Therapy:  Group Therapy  Participation Level:  Did Not Attend; patient reports cramping from withdrawal as reason not to attend   Clide Dales 09/13/2012, 4:41 PM

## 2012-09-13 NOTE — Progress Notes (Signed)
[facility] Group Notes:  (Nursing/MHT/Case Management/Adjunct)  Date:  09/12/2012   Time:  2100  Type of Therapy:  wrap up group  Participation Level:  Active  Participation Quality:  Appropriate, Attentive and Sharing  Affect:  Anxious and Appropriate  Cognitive:  Appropriate  Insight:  Good  Engagement in Group:  Engaged and restless  Modes of Intervention:  Clarification, Education and Support  Summary of Progress/Problems: Pt reports having a strong desire to get back to living life. When asked to give an example of what living life is to her pt stated " being outside, going to parks, being around people rather that being by myself, alone in a dark room using.  Pt is grateful for another chance and is happy today is another day she didn't use.  Pt addressed this Clinical research associate after group concerned about her miscarriage  she learned about when coming to the hospital for detox. Pt states, " Knowing that this is inside of me is really messing with my head.  I was told that I would be able to get help with this here. I would like a DNC.  Could you please find out some information for me please as to what I am to do?".  Shelah Lewandowsky 09/13/2012, 1:59 AM

## 2012-09-13 NOTE — Progress Notes (Signed)
Pt is resting in bed with eyes closed. RR WNL, even and unlabored. No distress noted. Level III obs in place and pt remains safe. Friedman, Marian Eakes  

## 2012-09-13 NOTE — Progress Notes (Signed)
D:  Crystal [name] reports that she slept well and that her appetite is improving.  Her energy level is low and her ability to pay attention is poor.  She rates hopelessness at 4/10 and depression at 5/10.  She complains of agitation and cravings.  She complains of pain at time in her back and received a toradol injection which was minimally helpful.  She denies SI/HI/AVH at this time.  She is attending groups and is interacting appropriately with staff and other people. A:  Medications administered as ordered.  Safety checks q 15 minutes.  Emotional support provided. R:  Safety maintained on unit.

## 2012-09-13 NOTE — Progress Notes (Signed)
[facility] LCSW Aftercare Discharge Planning Group Note  09/13/2012 8:45 AM  Participation Quality:  Attentive and Sharing  Affect:  Appropriate  Cognitive:  Alert and Oriented  Insight:   Limited  Engagement in Group:   Limited  Modes of Intervention:  Clarification, Exploration, Rapport Building and Support  Summary of Progress/Problems:  Pt denies both suicidal and homicidal ideation.  On a scale of 1 to 10 with ten being the most ever experienced, the patient rates depression at a 7 and anxiety at a 6. Patient most concerned re JMSW intern making contact with attorney.  Patient reports she has safe home to go to and [employer].    Clide Dales

## 2012-09-13 NOTE — Plan of Care (Signed)
Problem: Ineffective individual coping Goal: STG: Patient will participate in after care plan Outcome: Not Met (add Reason) Patient came to DC planning group today yet only focus was MSW Intern getting in touch with patient's attorney.  JMSW Intern agreed to contact attorney yet unwilling to commit to followup other than medication management  Problem: Alteration in mood & ability to function due to Goal: LTG-Patient demonstrates decreased signs of withdrawal (Patient demonstrates decreased signs of withdrawal to the point the patient is safe to return home and continue treatment in an outpatient setting)  Outcome: Not Met (add Reason) Patient reports unable to come to afternoon group as she was experiencing cramping from withdrawal.

## 2012-09-13 NOTE — Tx Team (Signed)
Interdisciplinary Treatment Plan Update (Adult)  Date: 09/13/2012  Time Reviewed: 10:15 AM   Progress in Treatment: Attending groups: Not consistently as yet Participating in groups: Not consistently  Taking medication as prescribed:  Yes Tolerating medication:  Yes Family/Significant othe contact made: Not as yet Patient understands diagnosis: Yes Discussing patient identified problems/goals with staff: Yes Medical problems stabilized or resolved:  Yes Denies suicidal/homicidal ideation: Yes Patient has not harmed self or Others: Yes  New problem(s) identified: None Identified  Discharge Plan or Barriers:  CSW is assessing for appropriate referrals.   Additional comments: N/A  Reason for Continuation of Hospitalization Anxiety  Depression Medical Issues Medication stabilization Withdrawal symptoms  Estimated length of stay: 5 days  For review of initial/current patient goals, please see plan of care.  Attendees: Patient:     Family:     Physician:  Geoffery Lyons 09/13/2012 10:15 AM   Nursing:   Burnetta Sabin, RN 09/13/2012 10:15 AM   Clinical Social Worker Ronda Fairly 09/13/2012 10:15 AM   Other:  Serena Colonel, PA 09/13/2012 10:15 AM   Other:  Concha Se, [place] PA Student 09/13/2012 10:15 AM   Other:  Vesta Mixer Liaison 09/13/2012 10:15 AM   Other:   09/13/2012 10:15 AM    Scribe for Treatment Team:   Carney Bern, LCSWA  09/13/2012 10:15 AM
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
## 2012-09-14 MED ORDER — ONDANSETRON 4 MG PO TBDP
4.0000 mg | ORAL_TABLET | Freq: Four times a day (QID) | ORAL | Status: AC | PRN
  Administered 2012-09-14 – 2012-09-16 (×2): 4 mg via ORAL

## 2012-09-14 MED ORDER — OLANZAPINE 5 MG PO TBDP
15.0000 mg | ORAL_TABLET | Freq: Every day | ORAL | Status: DC
  Administered 2012-09-14 – 2012-09-16 (×3): 15 mg via ORAL
  Filled 2012-09-14 (×4): qty 1

## 2012-09-14 MED ORDER — TRAZODONE HCL 50 MG PO TABS
50.0000 mg | ORAL_TABLET | Freq: Every day | ORAL | Status: DC
  Administered 2012-09-14 – 2012-09-15 (×2): 50 mg via ORAL
  Filled 2012-09-14 (×3): qty 1

## 2012-09-14 MED ORDER — LOPERAMIDE HCL 2 MG PO CAPS
2.0000 mg | ORAL_CAPSULE | ORAL | Status: AC | PRN
  Administered 2012-09-14: 2 mg via ORAL
  Administered 2012-09-16: 4 mg via ORAL
  Administered 2012-09-16: 2 mg via ORAL

## 2012-09-14 NOTE — Progress Notes (Addendum)
Adult Psychoeducational Group Note  Date:  09/14/2012 Time:  10:13 AM  Group Topic/Focus:  Self-inventory  Participation Level:  Active  Participation Quality:  Appropriate and Attentive  Affect:  Appropriate and Flat  Cognitive:  Alert and Appropriate  Insight: Appropriate and Good  Engagement in Group:  Engaged  Modes of Intervention:  Clarification and Discussion, Explanation, Support, Rapport Building, Orientation  Additional Comments:  Pt was attentive and actively participated in group, pt expressed concern about difficulty sleeping and requesting an increase in her Vit B12 dosage, assured pt that her RN would be notified, pt calm and cooperative, notified pt's RN of sleep disturbance and medication  concerns.  Alfonse Spruce 09/14/2012, 10:13 AM

## 2012-09-14 NOTE — Progress Notes (Signed)
D.  Pt bright and pleasant on approach. Still some muscle spasms she attributes to withdrawal.  Positive for evening group.  Interacting appropriately within milieu.  Denies SI/HI/hallucinations at this time.  A.  Support and encouragement offered, medication given as ordered  R.  Pt remains safe on unit, will continue to monitor.

## 2012-09-14 NOTE — Progress Notes (Signed)
D. Pt has been up and has been attending and participating in various milieu activities. Pt main complaint is chronic back pain and has requested and received PRN medications to address her pain. Pt also endorses feelings of anxiety but that she feels she is getting a little better. Pt does endorse depression and able to contract for safety on the unit. A. Support and encouragement provided. R. Will continue to monitor.

## 2012-09-14 NOTE — Progress Notes (Addendum)
Patient ID: Crystal [name], female   DOB: 1981-06-17, 32 y.o.   MRN: [id number] D: Pt is awake and active on the unit this AM. Pt denies SI/HI and A/V hallucinations. Pt is participating in the milieu and is cooperative with staff. Pt rates their depression at 5 and hopelessness at 4. Pt's most recent CIWA score was 7. Pt mood is anxious and her affect is sad. Pt is attention seeking and often requests PRN medications. Pt writes that she does not want to "pick the drugs back up."   A: Writer utilized therapeutic communication, encouraged pt to discuss feelings with staff and administered medication per MD orders. Writer also encouraged pt to attend groups.  R: Pt is attending groups and tolerating medications well. Writer will continue to monitor. 15 minute checks are ongoing for safety.

## 2012-09-14 NOTE — Progress Notes (Signed)
Patient ID: Crystal [name], female   DOB: 1980/08/27, 32 y.o.   MRN: [id number] D: Patient in the bed. Respirations even and non-labored A: Staff will monitor on q 15 minute checks, follow treatment plan, and give meds as ordered. R: Appears asleep

## 2012-09-14 NOTE — Progress Notes (Signed)
Adult Psychoeducational Group Note  Date:  09/14/2012 Time:  1315  Group Topic/Focus:  Healthy Coping Skills  Participation Level:  Active  Participation Quality:  Appropriate and Attentive  Affect:  Appropriate  Cognitive:  Alert and Appropriate  Insight: Lacking  Engagement in Group:  Distracting  Modes of Intervention:  Education and Exploration  Additional Comments:  Pt went in and out of group and sighed frequently  Crystal [name] Shari Prows 09/14/2012, 2:27 PM

## 2012-09-14 NOTE — Progress Notes (Signed)
The patient attended the A. A. Meeting this evening.  

## 2012-09-14 NOTE — Progress Notes (Signed)
Patient did attend the evening speaker AA meeting. Pt left in middle of group and went to her room but returned to group 20 minutes later.

## 2012-09-14 NOTE — Progress Notes (Addendum)
Patient ID: Crystal [name], female   DOB: Dec 20, 1980, 32 y.o.   MRN: [id number] [facility] MD Progress Note  09/14/2012 12:54 PM TEARA DUERKSEN  MRN:  [id number]  Subjective:  Crystal [name] complains of pain to lower back that radiates to her left leg. She requested for Toradol IM stating I should get this because it is not a narcotic. Complained that she did not sleep well last night, asked if she can get back on Trazodone? Ji asked if she can get back on Suboxone because it helped her stay sober from July - November of last year. Complained that it was stopped by Dr. Elsie Saas in [place] because she failed a drug test. Wants nothing than to have and raise her daughter again. Claimed has no support system except her drug buddies. Would like to go to a place like [facility], get cleaned again and have a job to support herself.   Diagnosis:  Opioid Dependence, Polysubstance Dependence, ADHD, Mood Disorder NOS  ADL's:  Intact  Sleep: Fair, 6.5 hours per documentation.  Appetite:  Fair  Suicidal Ideation:  Plan:  ideas on and off Intent:  denies  Means:  denies Homicidal Ideation:  Plan:  denies Intent:  denies Means:  denies AEB (as evidenced by):  Psychiatric Specialty Exam: Review of Systems  Constitutional: Negative.   HENT: Negative.   Eyes: Negative.   Respiratory: Negative.   Gastrointestinal: Negative.   Genitourinary: Negative.   Musculoskeletal: Positive for back pain.  Skin: Negative.   Neurological: Negative.   Endo/Heme/Allergies: Negative.   Psychiatric/Behavioral: Positive for depression and substance abuse. The patient is nervous/anxious and has insomnia.     Blood pressure 114/75, pulse 101, temperature 98.2 F (36.8 C), temperature source Oral, resp. rate 16, height 5\' 6"  (1.676 m), weight 58.06 kg (128 lb), last menstrual period 07/16/2012.Body mass index is 20.67 kg/(m^2).  General Appearance: Fairly Groomed  Patent attorney::  Fair  Speech:  Clear and Coherent and rapid   Volume:  Normal  Mood:  Anxious, Depressed, Hopeless and worried  Affect:  anxious  Thought Process:  Coherent and Goal Directed  Orientation:  Full (Time, Place, and Person)  Thought Content:  Rumination and worries, concerns, fears  Suicidal Thoughts:  Yes.  without intent/plan  Homicidal Thoughts:  No  Memory:  Immediate;   Fair Recent;   Fair Remote;   Fair  Judgement:  Fair  Insight:  superficial  Psychomotor Activity:  Restlessness  Concentration:  Fair  Recall:  Fair  Akathisia:  No  Handed:  Right  AIMS (if indicated):     Assets:  Desire for Improvement  Sleep:  Number of Hours: 6.5   Current Medications: Current Facility-Administered Medications  Medication Dose Route Frequency Provider Last Rate Last Dose  . acetaminophen (TYLENOL) tablet 650 mg  650 mg Oral Q6H PRN Rachael Fee, MD   650 mg at 09/12/12 0351  . albuterol-ipratropium (COMBIVENT) inhaler 2 puff  2 puff Inhalation Q6H PRN Kerry Hough, PA-C      . alum & mag hydroxide-simeth (MAALOX/MYLANTA) 200-200-20 MG/5ML suspension 30 mL  30 mL Oral Q4H PRN Rachael Fee, MD      . carbamazepine (Antipsychotic - EQUETRO) 12 hr capsule 200 mg  200 mg Oral BID Rachael Fee, MD   200 mg at 09/14/12 0758  . cloNIDine (CATAPRES) tablet 0.1 mg  0.1 mg Oral BH-qamhs Rachael Fee, MD   0.1 mg at 09/14/12 0757   Followed by  . [  START ON 09/16/2012] cloNIDine (CATAPRES) tablet 0.1 mg  0.1 mg Oral QAC breakfast Rachael Fee, MD      . dicyclomine (BENTYL) tablet 20 mg  20 mg Oral Q6H PRN Rachael Fee, MD      . DULoxetine (CYMBALTA) DR capsule 120 mg  120 mg Oral Daily Kerry Hough, PA-C   120 mg at 09/14/12 0757  . gabapentin (NEURONTIN) capsule 800 mg  800 mg Oral QID Kerry Hough, PA-C   800 mg at 09/14/12 1149  . hydrOXYzine (ATARAX/VISTARIL) tablet 25 mg  25 mg Oral Q6H PRN Rachael Fee, MD   25 mg at 09/12/12 2137  . lidocaine (LIDODERM) 5 % 1 patch  1 patch Transdermal Q24H Rachael Fee, MD   1 patch at  09/13/12 1706  . loperamide (IMODIUM) capsule 2-4 mg  2-4 mg Oral PRN Rachael Fee, MD      . methocarbamol (ROBAXIN) tablet 500 mg  500 mg Oral Q8H PRN Rachael Fee, MD   500 mg at 09/14/12 1149  . multivitamin with minerals tablet 1 tablet  1 tablet Oral Daily Rachael Fee, MD   1 tablet at 09/14/12 0757  . naproxen (NAPROSYN) tablet 500 mg  500 mg Oral BID PRN Rachael Fee, MD   500 mg at 09/14/12 1149  . nicotine (NICODERM CQ - dosed in mg/24 hours) patch 21 mg  21 mg Transdermal Daily Rachael Fee, MD   21 mg at 09/14/12 0617  . OLANZapine zydis (ZYPREXA) disintegrating tablet 10 mg  10 mg Oral QHS Rachael Fee, MD   10 mg at 09/13/12 2130  . ondansetron (ZOFRAN-ODT) disintegrating tablet 4 mg  4 mg Oral Q6H PRN Rachael Fee, MD   4 mg at 09/14/12 [phone]  . pantoprazole (PROTONIX) EC tablet 40 mg  40 mg Oral Daily Rachael Fee, MD   40 mg at 09/14/12 0758  . thiamine (VITAMIN B-1) tablet 100 mg  100 mg Oral Daily Rachael Fee, MD   100 mg at 09/14/12 0757  . vitamin B-12 (CYANOCOBALAMIN) tablet 100 mcg  100 mcg Oral Daily Rachael Fee, MD   100 mcg at 09/14/12 9604    Lab Results:  Results for orders placed during the hospital encounter of 09/11/12 (from the past 48 hour(s))  HCG, SERUM, QUALITATIVE     Status: Abnormal   Collection Time    09/13/12  7:32 PM      Result Value Range   Preg, Serum POSITIVE (*) NEGATIVE    Physical Findings: AIMS: Facial and Oral Movements Muscles of Facial Expression: None, normal Lips and Perioral Area: None, normal Jaw: None, normal Tongue: None, normal,Extremity Movements Upper (arms, wrists, hands, fingers): None, normal Lower (legs, knees, ankles, toes): None, normal, Trunk Movements Neck, shoulders, hips: None, normal, Overall Severity Severity of abnormal movements (highest score from questions above): None, normal Incapacitation due to abnormal movements: None, normal Patient's awareness of abnormal movements (rate only patient's  report): No Awareness, Dental Status Current problems with teeth and/or dentures?: No Does patient usually wear dentures?: No  CIWA:  CIWA-Ar Total: 7 COWS:  COWS Total Score: 12  Treatment Plan Summary: Daily contact with patient to assess and evaluate symptoms and progress in treatment Medication management  Plan: Supportive approach/coping skills/relapse prevention Continue to address the co morbidities. Increase Zyprexa from 10 mg to 15 mg. Add Trazodone 50 mg Q bedtime. Will recommend a long term care facility  for substance abuse as patient may benefit from it".      Medical Decision Making Problem Points:  Review of last therapy session (1) and Review of psycho-social stressors (1) Data Points:  Review of medication regiment & side effects (2) Review of new medications or change in dosage (2)  I certify that inpatient services furnished can reasonably be expected to improve the patient's condition.   Sanjuana Kava, PMHNP 09/14/2012, 12:54 PM  Discussed with provider and agree with above.  Jacqulyn Cane, M.D.  09/14/2012 11:11 PM

## 2012-09-14 NOTE — Clinical Social Work Note (Signed)
[facility] Group Notes:  (Clinical Social Work)  09/14/2012     10-11AM  Summary of Progress/Problems:   The main focus of today's process group was for the patient to identify ways in which they have in the past sabotaged their own recovery. Motivational Interviewing was utilized to ask the group members what they get out of their substance use, and what they want to change.  The Stages of Change were explained, and members identified where they currently are with regard to stages of change.  The patient expressed that she is chasing that first high she received, but has lost everything in her life as a result.  She uses now to numb herself so she does not have to deal with her losses.  She stated she is between contemplation stage and preparation, that she has made the decision to change.  Type of Therapy:  Group Therapy - Process   Participation Level:  Active  Participation Quality:  Appropriate, Attentive and Sharing  Affect:  Anxious and Blunted  Cognitive:  Appropriate  Insight:  Engaged  Engagement in Therapy:  Engaged  Modes of Intervention:  Education, Teacher, English as a foreign language, Exploration, Discussion, Motivational Interviewing   Ambrose Mantle, LCSW 09/14/2012, 12:14 PM

## 2012-09-15 LAB — HCG, QUANTITATIVE, PREGNANCY: hCG, Beta Chain, Quant, S: 52945 m[IU]/mL — ABNORMAL HIGH (ref <5)

## 2012-09-15 MED ORDER — HYDROXYZINE HCL 50 MG/ML IM SOLN: 50.0000 mg | INTRAMUSCULAR | Status: DC | PRN

## 2012-09-15 MED ORDER — HYDROXYZINE HCL 50 MG PO TABS
50.0000 mg | ORAL_TABLET | ORAL | Status: DC | PRN
  Administered 2012-09-15 – 2012-09-18 (×6): 50 mg via ORAL
  Filled 2012-09-15: qty 20

## 2012-09-15 MED ORDER — DIVALPROEX SODIUM ER 500 MG PO TB24: 500.0000 mg | ORAL_TABLET | Freq: Every day | ORAL | Status: DC

## 2012-09-15 MED ORDER — TRAZODONE HCL 100 MG PO TABS
150.0000 mg | ORAL_TABLET | Freq: Every evening | ORAL | Status: DC | PRN
  Administered 2012-09-15 – 2012-09-18 (×4): 150 mg via ORAL
  Filled 2012-09-15 (×5): qty 1

## 2012-09-15 NOTE — Progress Notes (Addendum)
Pt appears a little anxious and keeps wanting to know when she can have more anxiety medication.pt has been going to group and participating. No SI or HI and contracts for safety. Pt given 20mg  of bently for stomach cramps. Pt stated she needs more visteral and wanted top know the time. Pt requested visteral stating," my nerves are shot and I am all over the place. " pt does appear hyper at times.

## 2012-09-15 NOTE — Progress Notes (Addendum)
[facility] Group Notes:  (Nursing/MHT/Case Management/Adjunct)  Date:  09/15/2012  Time:  11:04 AM  Type of Therapy:  Psychoeducational Skills  Participation Level:  Minimal  Participation Quality:  Inattentive  Affect:  Flat and Irritable  Cognitive:  Disorganized  Insight:  Lacking  Engagement in Group:  Lacking  Modes of Intervention:  Discussion and Support  Summary of Progress/Problems: Pts participated in non-denominational spirituality group by reading "Ending the Cycle" from the Sunday workbook. Pts read through paragraph by paragraph out loud then discussed items that they felt were relevant to them. Pt was in and out of dayroom several times and inattentive during group.    Dalia Heading 09/15/2012, 11:04 AM

## 2012-09-15 NOTE — Progress Notes (Addendum)
Patient ID: Crystal [name], female   DOB: 1980-11-16, 32 y.o.   MRN: [id number] Patient ID: Crystal [name], female   DOB: September 18, 1980, 32 y.o.   MRN: [id number] [facility] MD Progress Note  09/15/2012 1:41 PM Crystal [name]  MRN:  [id number]  Subjective:  Crystal [name] continue to endorse restlessness, increased anxiety and racing thoughts. She states that "I have problem with focusing and concentration. She continue to ask for something to help her relax, focus or  at least be steady. I want to get well, be well and feel well. I need Suboxone treatment. I know I'm determined to stay clean this time, but I need some help doing it. When I was on Suboxone last time, I did well. The cravings were gone then, almost".  Diagnosis:  Opioid Dependence, Polysubstance Dependence, ADHD, Mood Disorder NOS  ADL's:  Intact  Sleep: Fair, 6.5 hours per documentation.  Appetite:  Fair  Suicidal Ideation:  Plan:  ideas on and off Intent:  denies  Means:  denies Homicidal Ideation:  Plan:  denies Intent:  denies Means:  denies AEB (as evidenced by):  Psychiatric Specialty Exam: Review of Systems  Constitutional: Negative.   HENT: Negative.   Eyes: Negative.   Respiratory: Negative.   Gastrointestinal: Negative.   Genitourinary: Negative.   Musculoskeletal: Positive for back pain.  Skin: Negative.   Neurological: Negative.   Endo/Heme/Allergies: Negative.   Psychiatric/Behavioral: Positive for depression and substance abuse. The patient is nervous/anxious and has insomnia.     Blood pressure 113/69, pulse 90, temperature 98.8 F (37.1 C), temperature source Oral, resp. rate 20, height 5\' 6"  (1.676 m), weight 58.06 kg (128 lb), last menstrual period 07/16/2012.Body mass index is 20.67 kg/(m^2).  General Appearance: Fairly Groomed  Patent attorney::  Fair  Speech:  Clear and Coherent and rapid  Volume:  Normal  Mood:  Anxious, Depressed, Hopeless and worried  Affect:  anxious  Thought Process:  Coherent and Goal  Directed  Orientation:  Full (Time, Place, and Person)  Thought Content:  Rumination and worries, concerns, fears  Suicidal Thoughts:  Yes.  without intent/plan  Homicidal Thoughts:  No  Memory:  Immediate;   Fair Recent;   Fair Remote;   Fair  Judgement:  Fair  Insight:  superficial  Psychomotor Activity:  Restlessness, anxiousness  Concentration:  Fair  Recall:  Fair  Akathisia:  No  Handed:  Right  AIMS (if indicated):     Assets:  Desire for Improvement  Sleep:  Number of Hours: 5   Current Medications: Current Facility-Administered Medications  Medication Dose Route Frequency Provider Last Rate Last Dose  . acetaminophen (TYLENOL) tablet 650 mg  650 mg Oral Q6H PRN Rachael Fee, MD   650 mg at 09/12/12 0351  . albuterol-ipratropium (COMBIVENT) inhaler 2 puff  2 puff Inhalation Q6H PRN Kerry Hough, PA-C      . alum & mag hydroxide-simeth (MAALOX/MYLANTA) 200-200-20 MG/5ML suspension 30 mL  30 mL Oral Q4H PRN Rachael Fee, MD      . carbamazepine (Antipsychotic - EQUETRO) 12 hr capsule 200 mg  200 mg Oral BID Sanjuana Kava, NP   200 mg at 09/15/12 0811  . cloNIDine (CATAPRES) tablet 0.1 mg  0.1 mg Oral BH-qamhs Rachael Fee, MD   0.1 mg at 09/15/12 5427   Followed by  . [START ON 09/16/2012] cloNIDine (CATAPRES) tablet 0.1 mg  0.1 mg Oral QAC breakfast Rachael Fee, MD      .  dicyclomine (BENTYL) tablet 20 mg  20 mg Oral Q6H PRN Rachael Fee, MD      . DULoxetine (CYMBALTA) DR capsule 120 mg  120 mg Oral Daily Kerry Hough, PA-C   120 mg at 09/15/12 0810  . gabapentin (NEURONTIN) capsule 800 mg  800 mg Oral QID Kerry Hough, PA-C   800 mg at 09/15/12 1137  . lidocaine (LIDODERM) 5 % 1 patch  1 patch Transdermal Q24H Rachael Fee, MD   1 patch at 09/14/12 1728  . loperamide (IMODIUM) capsule 2-4 mg  2-4 mg Oral PRN Rachael Fee, MD   2 mg at 09/14/12 1903  . methocarbamol (ROBAXIN) tablet 500 mg  500 mg Oral Q8H PRN Rachael Fee, MD   500 mg at 09/14/12 2135  .  multivitamin with minerals tablet 1 tablet  1 tablet Oral Daily Rachael Fee, MD   1 tablet at 09/15/12 0810  . naproxen (NAPROSYN) tablet 500 mg  500 mg Oral BID PRN Rachael Fee, MD   500 mg at 09/15/12 0815  . nicotine (NICODERM CQ - dosed in mg/24 hours) patch 21 mg  21 mg Transdermal Daily Rachael Fee, MD   21 mg at 09/15/12 0514  . OLANZapine zydis (ZYPREXA) disintegrating tablet 15 mg  15 mg Oral QHS Sanjuana Kava, NP   15 mg at 09/14/12 2134  . ondansetron (ZOFRAN-ODT) disintegrating tablet 4 mg  4 mg Oral Q6H PRN Rachael Fee, MD   4 mg at 09/14/12 1903  . pantoprazole (PROTONIX) EC tablet 40 mg  40 mg Oral Daily Rachael Fee, MD   40 mg at 09/15/12 4782  . thiamine (VITAMIN B-1) tablet 100 mg  100 mg Oral Daily Rachael Fee, MD   100 mg at 09/15/12 9562  . traZODone (DESYREL) tablet 50 mg  50 mg Oral QHS Sanjuana Kava, NP   50 mg at 09/14/12 2134  . vitamin B-12 (CYANOCOBALAMIN) tablet 100 mcg  100 mcg Oral Daily Rachael Fee, MD   100 mcg at 09/15/12 1308    Lab Results:  Results for orders placed during the hospital encounter of 09/11/12 (from the past 48 hour(s))  HCG, SERUM, QUALITATIVE     Status: Abnormal   Collection Time    09/13/12  7:32 PM      Result Value Range   Preg, Serum POSITIVE (*) NEGATIVE    Physical Findings: AIMS: Facial and Oral Movements Muscles of Facial Expression: None, normal Lips and Perioral Area: None, normal Jaw: None, normal Tongue: None, normal,Extremity Movements Upper (arms, wrists, hands, fingers): None, normal Lower (legs, knees, ankles, toes): None, normal, Trunk Movements Neck, shoulders, hips: None, normal, Overall Severity Severity of abnormal movements (highest score from questions above): None, normal Incapacitation due to abnormal movements: None, normal Patient's awareness of abnormal movements (rate only patient's report): No Awareness, Dental Status Current problems with teeth and/or dentures?: No Does patient usually  wear dentures?: No  CIWA:  CIWA-Ar Total: 4 COWS:  COWS Total Score: 3  Treatment Plan Summary: Daily contact with patient to assess and evaluate symptoms and progress in treatment Medication management  Plan: Supportive approach/coping skills/relapse prevention. Add hydroxyzine 50 mg Q 4 hours prn for anxiety/sleep Continue to address the co morbidities. Obtain Quantitative HCG today. Will recommend/suggest a long term care facility for substance abuse treatment as patient may benefit from it.      Medical Decision Making Problem Points:  Review  of last therapy session (1) and Review of psycho-social stressors (1) Data Points:  Review of medication regiment & side effects (2) Review of new medications or change in dosage (2)  I certify that inpatient services furnished can reasonably be expected to improve the patient's condition.   Sanjuana Kava, PMHNP 09/15/2012, 1:41 PM  Discussed with provider and agree with above.  Jacqulyn Cane, M.D.  09/15/2012 5:37 PM

## 2012-09-15 NOTE — Progress Notes (Signed)
Adult Psychoeducational Group Note  Date:  09/15/2012 Time:  1300  Group Topic/Focus:  Healthy Support Systems  Participation Level:  Did Not Attend Pt decided not to attend group.  Crystal [name] 09/15/2012, 1:54 PM

## 2012-09-15 NOTE — Progress Notes (Signed)
Adult Psychoeducational Group Note  Date:  09/15/2012 Time:  1515  Group Topic/Focus:  Making Healthy Choices:   The focus of this group is to help patients identify negative/unhealthy choices they were using prior to admission and identify positive/healthier coping strategies to replace them upon discharge.  Participation Level:  Active  Participation Quality:  Sharing  Affect:  Excited  Cognitive:  Oriented  Insight: Good  Engagement in Group:  Engaged  Modes of Intervention:  Discussion  Additional Comments:  Pt stated when she was clean for six months it was church that help her through her path.  Casilda Carls 09/15/2012, 4:58 PM

## 2012-09-15 NOTE — Progress Notes (Signed)
Patient did attend the evening speaker AA meeting. Pt exited group a few times but returned and finished group.

## 2012-09-15 NOTE — Progress Notes (Addendum)
Patient ID: Crystal [name], female   DOB: 1981/01/07, 32 y.o.   MRN: [id number] D: Pt is awake and active on the unit this AM. Pt denies SI/HI and A/V hallucinations. Pt rates their depression at 4 and hopelessness at 4. Pt's most recent COWS score was 3. Pt is exhibiting frequent medication seeking behaviors. Pt is demanding, manipulative and splits staff in order get as many PRN medications as possible. Pt is flirtatious with another pt on the unit and is disruptive during group by going in and out. Pt needs constant redirection for her medication seeking behaviors. Yet she often says that the medications are not benefiting her. Pt writes that she is "wondering what her discharge date will be," and that she wishes to "continue not to use."   A: Clinical research associate utilized therapeutic communication, encouraged pt to discuss feelings with staff and administered medication per MD orders. Writer also encouraged pt to attend groups.  R: Pt is attending most groups, at least temporarily, and tolerating medications well. Writer will continue to monitor. 15 minute checks are ongoing for safety.

## 2012-09-15 NOTE — Progress Notes (Signed)
D.  Pt pleasant on approach, states she did not sleep much last night and is really hoping that tonight with the dosage increase in her Trazodone that she will be able to.  Positive for evening group, interacting appropriately within milieu.  Denies SI/HI/hallucinations at this time.  A.  Support and encouragement offered.  Medication given as ordered  R. Pt remains safe, will continue to monitor.

## 2012-09-15 NOTE — Clinical Social Work Note (Signed)
[facility] Group Notes:  (Clinical Social Work)  09/15/2012  10:00-11:00AM  Summary of Progress/Problems:   The main focus of today's process group was to   identify the patient's current support system and decide on other supports that can be put in place to prevent future hospitalizations.  A handout was used to explain the 4 definitions/levels of support and to think about what support patient has given and received from others.  An emphasis was placed on using counselor, doctor, therapy groups, 12-step groups, and problem-specific support groups to expand supports. The patient expressed that her sole support is the hospital.  She came in and out of group several times.  When in the room, she was inattentive.  Type of Therapy:  Process Group with Motivational Interviewing  Participation Level:  Minimal  Participation Quality:  Inattentive  Affect:  Blunted  Cognitive:  Lacking  Insight:  Distracting  Engagement in Therapy:  Distracting  Modes of Intervention:  Clarification, Education, Limit-setting, Problem-solving, Socialization, Support and Processing, Exploration, Discussion, Role-Play   Crystal Mantle, LCSW 09/15/2012, 12:08 PM

## 2012-09-16 MED ORDER — DULOXETINE HCL 60 MG PO CPEP
60.0000 mg | ORAL_CAPSULE | Freq: Every day | ORAL | Status: DC
  Administered 2012-09-17: 60 mg via ORAL
  Filled 2012-09-16 (×2): qty 1

## 2012-09-16 MED ORDER — LORAZEPAM 1 MG PO TABS
1.0000 mg | ORAL_TABLET | Freq: Three times a day (TID) | ORAL | Status: DC | PRN
  Administered 2012-09-16 – 2012-09-17 (×3): 1 mg via ORAL
  Filled 2012-09-16 (×3): qty 1

## 2012-09-16 NOTE — Progress Notes (Signed)
Chaplain consulted with pt in response to nursing referral.   Provided emotional support, introduced spiritual care as resource.   Pt spoke with chaplain about desire to "stay clean."  Reported feeling anxious and unsettled with this feeling getting progressively worse.  Spoke with chaplain about feeling of intense craving over weekend.  Spoke about prior efforts at remaining clean and what was helpful.   Pt reports she has no support from family or friends.    Pt did not speak with chaplain about pregnancy.  Will follow up with plan to open space to support pt around miscarriage as needed.

## 2012-09-16 NOTE — Progress Notes (Signed)
Patient ID: Crystal [name], female   DOB: Jun 03, 1981, 32 y.o.   MRN: [id number] [facility] LCSW Group Therapy  09/16/2012 1:15 PM  Type of Therapy:  Group Therapy  Participation Level:  Did Not Attend; CSW unable to wake patient    Clide Dales 09/16/2012, 5:04 PM

## 2012-09-16 NOTE — Progress Notes (Signed)
D: Patient in the dayroom interacting with peers.  Patient laughing with peers and appeared to calm and in no distress.  Patient spoke with Clinical research associate in the hallway and patient states that she had cramping earlier in the  day and it is now relieved. Patient states she has been happy today.  Patient requests prn medications and patient became upset when she realized she did not have scheduled clonidine tonight.  Patient came out of group and states she was had a bout of diarrhea.  Patient denies SI/HI and denies AVH. A: Staff to monitor Q 15 mins for safety.  Encouragement and support offered.  Scheduled medications administered per orders.  Trazodone administered prn for insomnia.  Imodium administered prn for diarrhea.  Zofran administered prn for nausea.  Ativan administered prn anxiety.    R: Patient remains safe on the unit.  Patient attended group tonight.  Patient needy at times but cooperative.  Patient in bed resting in bed with eyes closed and in no apparent distress after reassessment after all prn medications.

## 2012-09-16 NOTE — Progress Notes (Signed)
Adult Psychoeducational Group Note  Date:  09/16/2012 Time:  4:19 PM  Group Topic/Focus:  Self Care:   The focus of this group is to help patients understand the importance of self-care in order to improve or restore emotional, physical, spiritual, interpersonal, and financial health.  Participation Level:  Active  Participation Quality:  Inattentive  Affect:  Appropriate and Flat  Cognitive:  Appropriate  Insight: Lacking  Engagement in Group:  Engaged  Modes of Intervention:  Discussion, Education and Support  Additional Comments:  Pt, staff, and other pts discussed the multidimensional nature of the self and the meaning of self-care. Pt actively participated in group by completing her self-care inventory. Pt shared that she would like to improve her psychological self-care by "taking more time for herself."  Elita Quick 09/16/2012, 4:19 PM

## 2012-09-16 NOTE — Progress Notes (Signed)
Patient ID: Crystal [name], female   DOB: 09/20/1980, 32 y.o.   MRN: [id number] She has been up and about and to groups today.She has c/o anxiety and received prn and she said it was effective.  She has been going to group and her self inventory:Depression 5, hopelessness 5, Withdraws symptoms, craving agitation and chilling, denies SI thoughts. The clergy staff spoke to her today and she spoke of her anxiety and of what she had done in the past per clergy.

## 2012-09-16 NOTE — Progress Notes (Signed)
Adult Psychoeducational Group Note  Date:  09/16/2012 Time:  11:08 AM  Group Topic/Focus:  Healthy Communication:   The focus of this group is to discuss communication, barriers to communication, as well as healthy ways to communicate with others.  Participation Level:  Active  Participation Quality:  Attentive  Affect:  Appropriate  Cognitive:  Oriented  Insight: Good  Engagement in Group:  Engaged  Modes of Intervention:  Discussion, Education and Support  Additional Comments:  Pt talked about thinking about the next step in life and wants to consider more treatment if it is appropriate  Caroline Matters T 09/16/2012, 11:08 AM

## 2012-09-16 NOTE — Progress Notes (Signed)
[facility] MD Progress Note  09/16/2012 4:17 PM Crystal [name]  MRN:  [id number] Subjective:  Crystal [name] endorses that she continues to have a hard time. She still feels very anxious, agitated. She feels that she has ADHD and that it has affected her functioning all along. She states she wants to do better. She is on Cymbalta and she does not think it is doing anything for her. While on Cymbalta she cant use Straterra She is worried about all the stressors out side of here she has to face and her ability to stay sober. She is also concerned about her inability to sleep. States that even with the Zyprexa being doubled she cant sleep. Requested to stay on the Ativan a little longer as it is the "only thing that works for her." Continues to be concerned about having the "dead baby" inside.  Diagnosis:  Opioid , polysubstance Dependence, ADHD, Mood Disorder NOS  ADL's:  Intact  Sleep: Poor  Appetite:  Fair  Suicidal Ideation:  Plan:  denies Intent:  denies Means:  denies Homicidal Ideation:  Plan:  denies Intent:  denies Means:  denies AEB (as evidenced by):  Psychiatric Specialty Exam: Review of Systems  Constitutional: Negative.   HENT: Negative.   Eyes: Negative.   Respiratory: Negative.   Cardiovascular: Negative.   Gastrointestinal: Negative.   Genitourinary: Negative.   Musculoskeletal: Negative.   Skin: Negative.   Neurological: Negative.   Endo/Heme/Allergies: Negative.   Psychiatric/Behavioral: Positive for depression and substance abuse. The patient is nervous/anxious and has insomnia.     Blood pressure 111/73, pulse 103, temperature 98.4 F (36.9 C), temperature source Oral, resp. rate 24, height 5\' 6"  (1.676 m), weight 58.06 kg (128 lb), last menstrual period 07/16/2012.Body mass index is 20.67 kg/(m^2).  General Appearance: Fairly Groomed  Patent attorney::  Fair  Speech:  Clear and Coherent and rapid  Volume:  Normal  Mood:  Anxious and worried  Affect:  anxious  Thought  Process:  Coherent and Goal Directed  Orientation:  Full (Time, Place, and Person)  Thought Content:  somatically/symtpom focused, worries and concerns  Suicidal Thoughts:  Yes.  without intent/plan  Homicidal Thoughts:  No  Memory:  Immediate;   Fair Recent;   Fair Remote;   Fair  Judgement:  Fair  Insight:  Shallow  Psychomotor Activity:  Restlessness  Concentration:  Fair  Recall:  Fair  Akathisia:  No  Handed:  Right  AIMS (if indicated):     Assets:  Desire for Improvement  Sleep:  Number of Hours: 4.75   Current Medications: Current Facility-Administered Medications  Medication Dose Route Frequency Provider Last Rate Last Dose  . acetaminophen (TYLENOL) tablet 650 mg  650 mg Oral Q6H PRN Rachael Fee, MD   650 mg at 09/12/12 0351  . albuterol-ipratropium (COMBIVENT) inhaler 2 puff  2 puff Inhalation Q6H PRN Kerry Hough, PA-C      . alum & mag hydroxide-simeth (MAALOX/MYLANTA) 200-200-20 MG/5ML suspension 30 mL  30 mL Oral Q4H PRN Rachael Fee, MD      . carbamazepine (Antipsychotic - EQUETRO) 12 hr capsule 200 mg  200 mg Oral BID Sanjuana Kava, NP   200 mg at 09/16/12 0808  . cloNIDine (CATAPRES) tablet 0.1 mg  0.1 mg Oral QAC breakfast Rachael Fee, MD   0.1 mg at 09/16/12 4098  . dicyclomine (BENTYL) tablet 20 mg  20 mg Oral Q6H PRN Rachael Fee, MD   20 mg at 09/16/12  1205  . [START ON 09/17/2012] DULoxetine (CYMBALTA) DR capsule 60 mg  60 mg Oral Daily Rachael Fee, MD      . gabapentin (NEURONTIN) capsule 800 mg  800 mg Oral QID Kerry Hough, PA-C   800 mg at 09/16/12 1205  . hydrOXYzine (ATARAX/VISTARIL) tablet 50 mg  50 mg Oral Q4H PRN Sanjuana Kava, NP   50 mg at 09/16/12 0540  . lidocaine (LIDODERM) 5 % 1 patch  1 patch Transdermal Q24H Rachael Fee, MD   1 patch at 09/15/12 1700  . loperamide (IMODIUM) capsule 2-4 mg  2-4 mg Oral PRN Rachael Fee, MD   2 mg at 09/14/12 1903  . LORazepam (ATIVAN) tablet 1 mg  1 mg Oral Q8H PRN Rachael Fee, MD   1 mg at  09/16/12 1038  . methocarbamol (ROBAXIN) tablet 500 mg  500 mg Oral Q8H PRN Rachael Fee, MD   500 mg at 09/15/12 2134  . multivitamin with minerals tablet 1 tablet  1 tablet Oral Daily Rachael Fee, MD   1 tablet at 09/16/12 1610  . naproxen (NAPROSYN) tablet 500 mg  500 mg Oral BID PRN Rachael Fee, MD   500 mg at 09/15/12 0815  . nicotine (NICODERM CQ - dosed in mg/24 hours) patch 21 mg  21 mg Transdermal Daily Rachael Fee, MD   21 mg at 09/16/12 0541  . OLANZapine zydis (ZYPREXA) disintegrating tablet 15 mg  15 mg Oral QHS Sanjuana Kava, NP   15 mg at 09/15/12 2133  . ondansetron (ZOFRAN-ODT) disintegrating tablet 4 mg  4 mg Oral Q6H PRN Rachael Fee, MD   4 mg at 09/14/12 1903  . pantoprazole (PROTONIX) EC tablet 40 mg  40 mg Oral Daily Rachael Fee, MD   40 mg at 09/16/12 9604  . thiamine (VITAMIN B-1) tablet 100 mg  100 mg Oral Daily Rachael Fee, MD   100 mg at 09/16/12 5409  . traZODone (DESYREL) tablet 150 mg  150 mg Oral QHS PRN,MR X 1 Sanjuana Kava, NP   150 mg at 09/15/12 2245  . vitamin B-12 (CYANOCOBALAMIN) tablet 100 mcg  100 mcg Oral Daily Rachael Fee, MD   100 mcg at 09/16/12 8119    Lab Results:  Results for orders placed during the hospital encounter of 09/11/12 (from the past 48 hour(s))  HCG, QUANTITATIVE, PREGNANCY     Status: Abnormal   Collection Time    09/15/12  7:25 PM      Result Value Range   hCG, Beta Chain, Quant, S 52945 (*) <5 mIU/mL   Comment:              GEST. AGE      CONC.  (mIU/mL)       <=1 WEEK        5 - 50         2 WEEKS       50 - 500         3 WEEKS       100 - 10,000         4 WEEKS     1,000 - 30,000         5 WEEKS     3,500 - 115,000       6-8 WEEKS     12,000 - 270,000        12 WEEKS     15,000 -  220,000                FEMALE AND NON-PREGNANT FEMALE:         LESS THAN 5 mIU/mL    Physical Findings: AIMS: Facial and Oral Movements Muscles of Facial Expression: None, normal Lips and Perioral Area: None, normal Jaw: None,  normal Tongue: None, normal,Extremity Movements Upper (arms, wrists, hands, fingers): None, normal Lower (legs, knees, ankles, toes): None, normal, Trunk Movements Neck, shoulders, hips: None, normal, Overall Severity Severity of abnormal movements (highest score from questions above): None, normal Incapacitation due to abnormal movements: None, normal Patient's awareness of abnormal movements (rate only patient's report): No Awareness, Dental Status Current problems with teeth and/or dentures?: No Does patient usually wear dentures?: No  CIWA:  CIWA-Ar Total: 4 COWS:  COWS Total Score: 6  Treatment Plan Summary: Daily contact with patient to assess and evaluate symptoms and progress in treatment Medication management  Plan: Supportive approach/coping skills/relapse prevention/CBT           Decrease the Cymbalta to 60 mg daily             Medical Decision Making Problem Points:  Review of psycho-social stressors (1) Data Points:  Review of medication regiment & side effects (2) Review of new medications or change in dosage (2)  I certify that inpatient services furnished can reasonably be expected to improve the patient's condition.   Latysha Thackston A 09/16/2012, 4:17 PM

## 2012-09-16 NOTE — Progress Notes (Signed)
[facility] LCSW Aftercare Discharge Planning Group Note  09/16/2012 8:45 AM  Participation Quality:  Attentive and Sharing  Affect:  Appropriate  Cognitive:  Alert and Oriented  Insight:  Limited  Engagement in Group:  Developing/Improving  Modes of Intervention:  Clarification, Exploration, Problem-solving and Support  Summary of Progress/Problems: Pt denies both suicidal and homicidal ideation.  On a scale of 1 to 10 with ten being the most ever experienced, the patient rates depression at a 5 and anxiety at a 9. As per patient report current anxiety level is normal  Patient reports she wishes to followup at a suboxone program in [facility] as she plans to stay here vs returning to [place] area. Patient reports cravings were intense over weekend.    Clide Dales 09/16/2012, 3:29 PM

## 2012-09-17 MED ORDER — ATOMOXETINE HCL 18 MG PO CAPS
18.0000 mg | ORAL_CAPSULE | Freq: Every day | ORAL | Status: DC
  Administered 2012-09-17 – 2012-09-18 (×2): 18 mg via ORAL
  Filled 2012-09-17 (×5): qty 1

## 2012-09-17 MED ORDER — LORAZEPAM 1 MG PO TABS
1.0000 mg | ORAL_TABLET | Freq: Two times a day (BID) | ORAL | Status: DC | PRN
  Administered 2012-09-17 – 2012-09-18 (×2): 1 mg via ORAL
  Filled 2012-09-17 (×2): qty 1

## 2012-09-17 MED ORDER — QUETIAPINE FUMARATE 200 MG PO TABS
200.0000 mg | ORAL_TABLET | Freq: Every day | ORAL | Status: DC
  Administered 2012-09-17: 200 mg via ORAL
  Filled 2012-09-17 (×3): qty 1

## 2012-09-17 MED ORDER — IBUPROFEN 600 MG PO TABS
600.0000 mg | ORAL_TABLET | Freq: Four times a day (QID) | ORAL | Status: DC | PRN
  Administered 2012-09-17: 600 mg via ORAL
  Filled 2012-09-17: qty 1

## 2012-09-17 NOTE — Progress Notes (Signed)
D: Patient in bed on approach.  Patient states she is tired because she had a long day.  Patient states  She thinks she is still going through withdrawals.  Patient states since she is in here she is learning coping skills.  Patient states she needs to prevent relapse.  Patient denies SI/HI and denies AVH.   A: Staff to monitor Q 15 mins for safety.  Encouragement and support offered.  Scheduled medications administered per orders.  Tylenol administered prn for a toothache. R: Patient remains safe on the unit.  Patient attended group tonight.  Patient calm, cooperative and taking administered medications.

## 2012-09-17 NOTE — Progress Notes (Signed)
[facility] LCSW Group Therapy  09/17/2012 1:15 PM  Type of Therapy:  Group Therapy 1:15 to 2:30   Participation Level:  Did Not Attend   Clide Dales

## 2012-09-17 NOTE — Progress Notes (Signed)
Recreation Therapy Notes  Date: 03.18.2014  Time: 3:00pm  Location: [facility]   Group Topic/Focus: Goal Setting   Participation Level:  Active   Participation Quality:  Appropriate   Affect:  Euthymic   Cognitive:  Appropriate   Additional Comments: Group defined SMART goal. Patient was given a worksheet with a shamrock on it. Patient was asked to identify one goal related to treatment and write this goal in the center of the shamrock. Patient was asked to identify 3 obstacles that might prevent patient from reaching goal. Patient colored her shamrock and listed her goal at the bottom of the page. Patient did not identify obstacles she might encounter. Patient stated she enjoyed group because she had an activity to do and it was easier for her concentrate with something to do. Patient stated that structure helps her stay sober. Patient participated in group discussion regarding positive effect of goal setting on sobriety.   Marykay Lex Lauralye Kinn, LRT/CTRS  Jearl Klinefelter 09/17/2012 3:51 PM

## 2012-09-17 NOTE — Progress Notes (Signed)
Due to weather social workers were not able to host 0845 after care planning group. Psycho educational group was held at 0845 and after care planning group is going to be held at 1100. This psycho educational group consisted of using the therapeutic question ball to describe positive situations and explaining short and long term goals. Writer was able to incorporate recovery (the topic of the day) in the group by explaining the importance of recovery and setting reasonable goal for self to become the better person that they want themselves to be.  

## 2012-09-17 NOTE — Progress Notes (Signed)
[facility] LCSW Aftercare Discharge Planning Group Note  09/17/2012 11:00 AM  Participation Quality:  Appropriate  Affect:  Anxious  Cognitive:  Alert and Oriented  Insight:  Limited  Engagement in Group:  Developing/Improving  Modes of Intervention:  Clarification, Exploration, Rapport Building, Socialization and Support  Summary of Progress/Problems: Pt denies both suicidal and homicidal ideation.  On a scale of 1 to 10 with ten being the most ever experienced, the patient rates depression at a 4 and anxiety at a 7. Patient continues to request door to door treatment which would be ideal; unfortunately her county services are extremely limited.  RTS reports 6 month waiting list.  Patient ineligible for services offered to [facility] residents although she continues to request.  CSW to provide list of options for patient to explore.     Clide Dales

## 2012-09-17 NOTE — Progress Notes (Signed)
[facility] MD Progress Note  09/17/2012 5:44 PM [name]  MRN:  [id number] Subjective:  Crystal [name] endorses that the Ativan did help a lot. Expresses disappointment in that she cant stay on Ativan long term. She is not sleeping as well with the Zyprexa. Would rather try the Seroquel and deal with the side effects. States it was effective. She states that the more she thinks about it the more she has realized that there is underlying ADHD behind her hyperactivity, her impulsivity. Will like to try the Stratterra as she goes off the Cymbalta Diagnosis:  Polysubstance Dependence, Opioid Dependence, ADHD  ADL's:  Intact  Sleep: Poor  Appetite:  Fair  Suicidal Ideation:  Plan:  denies Intent:  denies Means:  denies Homicidal Ideation:  Plan:  denies Intent:  denies Means:  denies AEB (as evidenced by):  Psychiatric Specialty Exam: Review of Systems  Constitutional: Negative.   HENT: Negative.   Eyes: Negative.   Respiratory: Negative.   Cardiovascular: Negative.   Gastrointestinal: Negative.   Genitourinary: Negative.   Musculoskeletal: Negative.   Skin: Negative.   Neurological: Negative.   Endo/Heme/Allergies: Negative.   Psychiatric/Behavioral: Positive for substance abuse. The patient is nervous/anxious and has insomnia.     Blood pressure 93/59, pulse 97, temperature 98.3 F (36.8 C), temperature source Oral, resp. rate 16, height 5\' 6"  (1.676 m), weight 58.06 kg (128 lb), last menstrual period 07/16/2012.Body mass index is 20.67 kg/(m^2).  General Appearance: Fairly Groomed  Patent attorney::  Fair  Speech:  Clear and Coherent and not rapid (claims because she took the Ativan earlier)  Volume:  Normal  Mood:  Anxious and worried  Affect:  Appropriate  Thought Process:  Coherent and Goal Directed  Orientation:  Full (Time, Place, and Person)  Thought Content:  worries, concerns  Suicidal Thoughts:  No  Homicidal Thoughts:  No  Memory:  Immediate;   Fair Recent;    Fair Remote;   Fair  Judgement:  Fair  Insight:  Present  Psychomotor Activity:  Restlessness  Concentration:  Fair  Recall:  Fair  Akathisia:  No  Handed:  Right  AIMS (if indicated):     Assets:  Desire for Improvement  Sleep:  Number of Hours: 4.5   Current Medications: Current Facility-Administered Medications  Medication Dose Route Frequency Provider Last Rate Last Dose  . acetaminophen (TYLENOL) tablet 650 mg  650 mg Oral Q6H PRN Rachael Fee, MD   650 mg at 09/12/12 0351  . albuterol-ipratropium (COMBIVENT) inhaler 2 puff  2 puff Inhalation Q6H PRN Kerry Hough, PA-C      . alum & mag hydroxide-simeth (MAALOX/MYLANTA) 200-200-20 MG/5ML suspension 30 mL  30 mL Oral Q4H PRN Rachael Fee, MD      . atomoxetine (STRATTERA) capsule 18 mg  18 mg Oral Daily Rachael Fee, MD   18 mg at 09/17/12 1148  . carbamazepine (Antipsychotic - EQUETRO) 12 hr capsule 200 mg  200 mg Oral BID Sanjuana Kava, NP   200 mg at 09/17/12 1708  . gabapentin (NEURONTIN) capsule 800 mg  800 mg Oral QID Kerry Hough, PA-C   800 mg at 09/17/12 1708  . hydrOXYzine (ATARAX/VISTARIL) tablet 50 mg  50 mg Oral Q4H PRN Sanjuana Kava, NP   50 mg at 09/16/12 0540  . ibuprofen (ADVIL,MOTRIN) tablet 600 mg  600 mg Oral Q6H PRN Sanjuana Kava, NP   600 mg at 09/17/12 1709  . lidocaine (LIDODERM) 5 % 1  patch  1 patch Transdermal Q24H Rachael Fee, MD   1 patch at 09/17/12 1708  . loperamide (IMODIUM) capsule 2-4 mg  2-4 mg Oral PRN Rachael Fee, MD   2 mg at 09/16/12 2128  . LORazepam (ATIVAN) tablet 1 mg  1 mg Oral Q12H PRN Rachael Fee, MD      . multivitamin with minerals tablet 1 tablet  1 tablet Oral Daily Rachael Fee, MD   1 tablet at 09/17/12 0813  . nicotine (NICODERM CQ - dosed in mg/24 hours) patch 21 mg  21 mg Transdermal Daily Rachael Fee, MD   21 mg at 09/17/12 [phone]  . ondansetron (ZOFRAN-ODT) disintegrating tablet 4 mg  4 mg Oral Q6H PRN Rachael Fee, MD   4 mg at 09/16/12 2129  . pantoprazole  (PROTONIX) EC tablet 40 mg  40 mg Oral Daily Rachael Fee, MD   40 mg at 09/17/12 0813  . QUEtiapine (SEROQUEL) tablet 200 mg  200 mg Oral QHS Rachael Fee, MD      . thiamine (VITAMIN B-1) tablet 100 mg  100 mg Oral Daily Rachael Fee, MD   100 mg at 09/17/12 0813  . traZODone (DESYREL) tablet 150 mg  150 mg Oral QHS PRN,MR X 1 Sanjuana Kava, NP   150 mg at 09/16/12 2128  . vitamin B-12 (CYANOCOBALAMIN) tablet 100 mcg  100 mcg Oral Daily Rachael Fee, MD   100 mcg at 09/17/12 2956    Lab Results:  Results for orders placed during the hospital encounter of 09/11/12 (from the past 48 hour(s))  HCG, QUANTITATIVE, PREGNANCY     Status: Abnormal   Collection Time    09/15/12  7:25 PM      Result Value Range   hCG, Beta Chain, Quant, S 52945 (*) <5 mIU/mL   Comment:              GEST. AGE      CONC.  (mIU/mL)       <=1 WEEK        5 - 50         2 WEEKS       50 - 500         3 WEEKS       100 - 10,000         4 WEEKS     1,000 - 30,000         5 WEEKS     3,500 - 115,000       6-8 WEEKS     12,000 - 270,000        12 WEEKS     15,000 - 220,000                FEMALE AND NON-PREGNANT FEMALE:         LESS THAN 5 mIU/mL  HCG, QUANTITATIVE, PREGNANCY     Status: Abnormal   Collection Time    09/17/12  6:00 AM      Result Value Range   hCG, Beta Chain, Quant, S 47816 (*) <5 mIU/mL   Comment:              GEST. AGE      CONC.  (mIU/mL)       <=1 WEEK        5 - 50         2 WEEKS       50 - 500  3 WEEKS       100 - 10,000         4 WEEKS     1,000 - 30,000         5 WEEKS     3,500 - 115,000       6-8 WEEKS     12,000 - 270,000        12 WEEKS     15,000 - 220,000                FEMALE AND NON-PREGNANT FEMALE:         LESS THAN 5 mIU/mL  HIV ANTIBODY (ROUTINE TESTING)     Status: None   Collection Time    09/17/12  6:00 AM      Result Value Range   HIV NON REACTIVE  NON REACTIVE    Physical Findings: AIMS: Facial and Oral Movements Muscles of Facial Expression: None,  normal Lips and Perioral Area: None, normal Jaw: None, normal Tongue: None, normal,Extremity Movements Upper (arms, wrists, hands, fingers): None, normal Lower (legs, knees, ankles, toes): None, normal, Trunk Movements Neck, shoulders, hips: None, normal, Overall Severity Severity of abnormal movements (highest score from questions above): None, normal Incapacitation due to abnormal movements: None, normal Patient's awareness of abnormal movements (rate only patient's report): No Awareness, Dental Status Current problems with teeth and/or dentures?: No Does patient usually wear dentures?: No  CIWA:  CIWA-Ar Total: 4 COWS:  COWS Total Score: 6  Treatment Plan Summary: Daily contact with patient to assess and evaluate symptoms and progress in treatment Medication management  Plan: Supportive approach/coping skills/relapse prevention           D/C the Cymbalta           D/C Zyprexa           Seroquel 200 mg HS            Stratterra 18 mg daily (trial basis)  Medical Decision Making Problem Points:  Review of psycho-social stressors (1) Data Points:  Review of medication regiment & side effects (2) Review of new medications or change in dosage (2)  I certify that inpatient services furnished can reasonably be expected to improve the patient's condition.   Cleburn Maiolo A 09/17/2012, 5:44 PM

## 2012-09-18 MED ORDER — GABAPENTIN 800 MG PO TABS
800.0000 mg | ORAL_TABLET | Freq: Four times a day (QID) | ORAL | Status: DC
  Filled 2012-09-18: qty 16

## 2012-09-18 MED ORDER — LIDOCAINE 5 % EX PTCH: 1.0000 | MEDICATED_PATCH | CUTANEOUS | Status: DC

## 2012-09-18 MED ORDER — MULTI-VITAMIN/MINERALS PO TABS: 1.0000 | ORAL_TABLET | Freq: Every day | ORAL | Status: AC

## 2012-09-18 MED ORDER — CARBAMAZEPINE ER 200 MG PO CP12: 200.0000 mg | ORAL_CAPSULE | Freq: Two times a day (BID) | ORAL | Status: DC

## 2012-09-18 MED ORDER — TRAZODONE HCL 150 MG PO TABS: 150.0000 mg | ORAL_TABLET | Freq: Every evening | ORAL | Status: DC | PRN

## 2012-09-18 MED ORDER — IPRATROPIUM-ALBUTEROL 18-103 MCG/ACT IN AERO: 2.0000 | INHALATION_SPRAY | Freq: Four times a day (QID) | RESPIRATORY_TRACT | Status: DC | PRN

## 2012-09-18 MED ORDER — QUETIAPINE FUMARATE 200 MG PO TABS: 200.0000 mg | ORAL_TABLET | Freq: Every day | ORAL | Status: DC

## 2012-09-18 MED ORDER — GABAPENTIN 400 MG PO CAPS: 800.0000 mg | ORAL_CAPSULE | Freq: Four times a day (QID) | ORAL | Status: DC

## 2012-09-18 MED ORDER — ATOMOXETINE HCL 18 MG PO CAPS: ORAL_CAPSULE | ORAL | Status: DC

## 2012-09-18 MED ORDER — CARBAMAZEPINE ER 100 MG PO CP12
200.0000 mg | ORAL_CAPSULE | Freq: Two times a day (BID) | ORAL | Status: DC
  Filled 2012-09-18 (×2): qty 28

## 2012-09-18 MED ORDER — IBUPROFEN 800 MG PO TABS: 800.0000 mg | ORAL_TABLET | Freq: Three times a day (TID) | ORAL | Status: AC | PRN

## 2012-09-18 MED ORDER — CYANOCOBALAMIN 100 MCG PO TABS: 100.0000 ug | ORAL_TABLET | Freq: Every day | ORAL | Status: AC

## 2012-09-18 MED ORDER — TRAZODONE HCL 50 MG PO TABS
150.0000 mg | ORAL_TABLET | Freq: Every evening | ORAL | Status: DC | PRN
  Filled 2012-09-18: qty 24

## 2012-09-18 MED ORDER — HYDROXYZINE HCL 50 MG PO TABS: 50.0000 mg | ORAL_TABLET | ORAL | Status: AC | PRN

## 2012-09-18 NOTE — Tx Team (Signed)
Interdisciplinary Treatment Plan Update (Adult)  Date: 09/18/2012  Time Reviewed: 10:16 AM   Progress in Treatment: Attending groups: Yes Participating in groups: Yes Taking medication as prescribed:  Yes Tolerating medication:  Yes Family/Significant othe contact made: No Patient understands diagnosis: Yes Discussing patient identified problems/goals with staff: Yes Medical problems stabilized or resolved:  Yes Denies suicidal/homicidal ideation: Yes Patient has not harmed self or Others: Yes  New problem(s) identified: None Identified  Discharge Plan or Barriers:  Patient will follow up in [facility] for medication management.  Additional comments: N/A  Reason for Continuation of Hospitalization: NA  Estimated length of stay: Discharge Today  For review of initial/current patient goals, please see plan of care.  Attendees: Patient:     Family:     Physician:  Geoffery Lyons 09/18/2012 10:16 AM   Nursing:  Roswell Miners, RN  09/18/2012 10:16 AM   Clinical Social Worker Ronda Fairly 09/18/2012 10:16 AM   Other:   Stephan Minister Liason 09/18/2012 10:16 AM   Other:  Concha Se, [place] PA Student 09/18/2012 10:16 AM   Other:  Robbie Louis, RN 09/18/2012 10:16 AM   Other:  Serena Colonel, PA  09/18/2012 10:16 AM    Scribe for Treatment Team:   Carney Bern, LCSWA  09/18/2012 10:16 AM

## 2012-09-18 NOTE — Progress Notes (Signed)
Patient ID: Crystal [name], female   DOB: [birth year], 32 y.o.   MRN: [id number] She has been up and about and to group. Interacting with peers and staff. Has been observed laughing and joking with peers the she has requested and received prn medication for anxiety. Also requested Maalox  for her indigestion. Stated that she ws not going home because that was here her memories were and that she and her new friend were going to a meeting today at 5 PM. She was advised that hanging out with her new friend might not be the best plan.

## 2012-09-18 NOTE — Discharge Summary (Signed)
Physician Discharge Summary Note  Patient:  Crystal [name] is an 32 y.o., female MRN:  [id number] DOB:  1980-07-19 Patient phone:  [phone] (home)  Patient address:   [address] [place] [address],   Date of Admission:  09/11/2012 Date of Discharge: 09/18/12  Reason for Admission:  Polysubstance abuse  Discharge Diagnoses: Active Problems:   Polysubstance dependence   Opioid dependence   ADHD (attention deficit hyperactivity disorder)   Unspecified episodic mood disorder  Review of Systems  Constitutional: Negative.   HENT: Negative.   Eyes: Negative.   Cardiovascular: Negative.   Gastrointestinal: Positive for abdominal pain.  Genitourinary: Negative.   Skin: Negative.   Neurological: Negative.   Endo/Heme/Allergies: Negative.   Psychiatric/Behavioral: Positive for depression (Stabilized with medication prior to discharge) and substance abuse (Hx opiate abuse). Negative for suicidal ideas, hallucinations and memory loss. The patient is nervous/anxious (Stabilized with medication prior to discharge) and has insomnia (Stabilized with medication prior todischarge).    Axis Diagnosis:   AXIS I:  Polysubstance dependence, Opioid dependence AXIS II:  Deferred AXIS III:   Past Medical History  Diagnosis Date  . Mental disorder   . Asthma   . Headache   . Anemia    AXIS IV:  economic problems, other psychosocial or environmental problems and Substance abuse issues AXIS V:  64  Level of Care:  OP  Hospital Course:  States she abuses cocaine (powder, crack), Heroin IV, pain pills (vicodin, percocet), alcohol 3-4 beers every day, couple of shots. On and off for 13 years. "Coke" the longest. Last detox July 2013 at [facility]. Then went to [facility] for 30 days. Went back home, got a job. Was on Suboxone maintenance, did well. Six months later she was weaned off. She had tested positive for cocaine. Off Suboxone she relapsed on January 2014. Since then she has not been  able to get off. In [facility] she was told she was pregnant, no heart beat. Has had a miscarriage.  Upon her admission into this hospital and after admission assessment/evaluation, it was determined that Ms. Peachey will need detoxification treatment protocol to stabilize her system of substance intoxication and to combat the withdrawal symptoms as well. She was then started on clonidine protocol for her opiate detoxification. Besides the clonidine treatment protocol, Ms. Horsman also received treatment for her other mental health issues. She was prescribed and received Equetro 200 mg for mood stabilization, Gabapentin 400 mg for anxiety/pain control, Hydroxyzine 50 mg for anxiety/sleep, Seroquel 200 mg for mood control, Strattera on titrating doses for ADHD and Trazodone 150 mg Q bedtime for sleep.       She was also enrolled in group counseling sessions and activities to learn coping skills that should help her after discharge to cope better and manage her substance abuse issues for a much longer sobriety. She attended AA/NA meetings being offered and held on this unit. She has other previous and or identifiable medical conditions that required treatment and or monitoring, such as nonviable pregnancy. She was monitored closely for any potential problems that may arise as a result of and or during detoxification treatment. Patient tolerated her detoxification treatment without any significant adverse effects and or reactions presented.   Patient attended treatment team meeting this am and met with the treatment team members. Her reason for admission, present symptoms, substance abuse issues, response  to treatment and discharge plans discussed. Patient endorsed that she is doing well and stable for discharge to pursue the  next phase of her substance abuse treatment. It was agreed upon that she will continue psychiatric care at the [facility] outpatient clinic in [place], [place] on 09/23/12 @ 08:30 am. The  address, date and time for this appointment provided for patient in writing. An attempt was also made to schedule an OBGYN appointment at the [facility] in [place], [place] for her non-viable pregnancy condition to be evaluated. But the [facility] OBGYN staff instructed that Ms. Erpelding should go to her primary care provider listed on her medicaid card to have a pregnancy test done. And then Ms. Shader should bring the result of the test to the OBGYN clinic and schedule an appointment for OBGYN services. Ms. Fiebig has been instructed as such and encouraged to follow the instruction and guideline given by the [facility] provider.  Ms. Comunale is currently being discharged to her home. Upon discharge, patient adamantly denies suicidal, homicidal ideations, auditory, visual hallucinations, delusional thinking and or withdrawal symptoms. Patient left [facility] with all personal belongings in no apparent distress. Transportation per patient arrangement. She received 2 weeks worth supply of her [facility] discharged medications.   Consults:  None  Significant Diagnostic Studies:  labs: CBC with diff, CMP, UDS, Toxicology Tests, HCG, gaulitative and quantitative  Discharge Vitals:   Blood pressure 106/70, pulse 105, temperature 97.9 F (36.6 C), temperature source Oral, resp. rate 18, height 5\' 6"  (1.676 m), weight 58.06 kg (128 lb), last menstrual period 07/16/2012. Body mass index is 20.67 kg/(m^2). Lab Results:   Results for orders placed during the hospital encounter of 09/11/12 (from the past 72 hour(s))  HCG, QUANTITATIVE, PREGNANCY     Status: Abnormal   Collection Time    09/17/12  6:00 AM      Result Value Range   hCG, Beta Chain, Quant, S 47816 (*) <5 mIU/mL   Comment:              GEST. AGE      CONC.  (mIU/mL)       <=1 WEEK        5 - 50         2 WEEKS       50 - 500         3 WEEKS       100 - 10,000         4 WEEKS     1,000 - 30,000         5 WEEKS     3,500 - 115,000       6-8 WEEKS      12,000 - 270,000        12 WEEKS     15,000 - 220,000                FEMALE AND NON-PREGNANT FEMALE:         LESS THAN 5 mIU/mL  HIV ANTIBODY (ROUTINE TESTING)     Status: None   Collection Time    09/17/12  6:00 AM      Result Value Range   HIV NON REACTIVE  NON REACTIVE    Physical Findings: AIMS: Facial and Oral Movements Muscles of Facial Expression: None, normal Lips and Perioral Area: None, normal Jaw: None, normal Tongue: None, normal,Extremity Movements Upper (arms, wrists, hands, fingers): None, normal Lower (legs, knees, ankles, toes): None, normal, Trunk Movements Neck, shoulders, hips: None, normal, Overall Severity Severity of abnormal movements (highest score from questions above): None, normal Incapacitation due to abnormal movements: None,  normal Patient's awareness of abnormal movements (rate only patient's report): No Awareness, Dental Status Current problems with teeth and/or dentures?: No Does patient usually wear dentures?: No  CIWA:  CIWA-Ar Total: 4 COWS:  COWS Total Score: 6  Psychiatric Specialty Exam: See Psychiatric Specialty Exam and Suicide Risk Assessment completed by Attending Physician prior to discharge.  Discharge destination:  Home  Is patient on multiple antipsychotic therapies at discharge:  No   Has Patient had three or more failed trials of antipsychotic monotherapy by history:  NO  Recommended Plan for Multiple Antipsychotic Therapies: NA     Medication List    STOP taking these medications       ARIPiprazole 15 MG tablet  Commonly known as:  ABILIFY     BIOFREEZE ROLL-ON 4 % Gel  Generic drug:  Menthol (Topical Analgesic)     carbamazepine 200 MG tablet  Commonly known as:  TEGRETOL     DULoxetine 60 MG capsule  Commonly known as:  CYMBALTA     ferrous sulfate 325 (65 FE) MG tablet     gabapentin 800 MG tablet  Commonly known as:  NEURONTIN      TAKE these medications     Indication   albuterol-ipratropium 18-103  MCG/ACT inhaler  Commonly known as:  COMBIVENT  Inhale 2 puffs into the lungs every 6 (six) hours as needed for wheezing or shortness of breath. For shortness of breath   Indication:  For asthma     atomoxetine 18 MG capsule  Commonly known as:  STRATTERA  See and follow instruction on the starter pack as directed.   Indication:  Attention Deficit Hyperactivity Disorder     carbamazepine 200 MG Cp12  Commonly known as:  Antipsychotic - EQUETRO  Take 1 capsule (200 mg total) by mouth 2 (two) times daily. For mood stabilization   Indication:  Manic-Depression     cyanocobalamin 100 MCG tablet  Take 1 tablet (100 mcg total) by mouth daily. For Cyanocobalamin deficiency   Indication:  Inadequate Vitamin B12     gabapentin 400 MG capsule  Commonly known as:  NEURONTIN  Take 2 capsules (800 mg total) by mouth 4 (four) times daily. For anxiety/pain control   Indication:  Agitation, Pain, Anxiety     hydrOXYzine 50 MG tablet  Commonly known as:  ATARAX/VISTARIL  Take 1 tablet (50 mg total) by mouth every 4 (four) hours as needed for anxiety. For anxiety/sleep   Indication:  Anxiety associated with Organic Disease, Tension, Sleep     ibuprofen 800 MG tablet  Commonly known as:  ADVIL,MOTRIN  Take 1 tablet (800 mg total) by mouth every 8 (eight) hours as needed for pain (with food). For moderate pain   Indication:  Joint Damage causing Pain and Loss of Function, Moderate pain     lidocaine 5 %  Commonly known as:  LIDODERM  Place 1 patch onto the skin daily. Remove & Discard patch within 12 hours or as directed by MD: for pain control   Indication:  pain management     multivitamin with minerals tablet  Take 1 tablet by mouth daily. For vitamin deficiency   Indication:  Viatmin deficiency     QUEtiapine 200 MG tablet  Commonly known as:  SEROQUEL  Take 1 tablet (200 mg total) by mouth at bedtime. For mood control   Indication:  Depressive Phase of Manic-Depression, Trouble  Sleeping, Manic Phase of Manic-Depression, mood instability     traZODone 150 MG tablet  Commonly known as:  DESYREL  Take 1 tablet (150 mg total) by mouth at bedtime as needed and may repeat dose one time if needed. For sleep/depression   Indication:  Trouble Sleeping, Major Depressive Disorder       Follow-up Information   Follow up with [facility] On 09/23/2012. (Go to [facility] at [facility] on Monday 3/24 for enrollment between 9:30 AM and 3:00 PM)    Contact information:   [address] [place] [address] [facility] [phone] FAX [phone]     Follow-up recommendations:  Activity:  As tolerated Diet: As recommended by your primary care doctor. Keep all scheduled follow-up appointments as recommended. Follow up relapse prevention plan Comments:  Take all your medications as prescribed by your mental healthcare provider. Report any adverse effects and or reactions from your medicines to your outpatient provider promptly. Patient is instructed and cautioned to not engage in alcohol and or illegal drug use while on prescription medicines. In the event of worsening symptoms, patient is instructed to call the crisis hotline, 911 and or go to the nearest ED for appropriate evaluation and treatment of symptoms. Follow-up with your primary care provider for your other medical issues, concerns and or health care needs.   Total Discharge Time:  Greater than 30 minutes.  SignedArmandina Stammer I 09/19/2012, 3:10 PM

## 2012-09-18 NOTE — Progress Notes (Signed)
Adult Psychoeducational Group Note  Date:  09/18/2012 Time:  7:08 PM  Group Topic/Focus:  Personal Choices and Values:   The focus of this group is to help patients assess and explore the importance of values in their lives, how their values affect their decisions, how they express their values and what opposes their expression.  Participation Level:  Did Not Attend   Additional Comments:  Pt was encouraged by staff to attend group, but pt refused.  Reinaldo Raddle K 09/18/2012, 7:08 PM

## 2012-09-18 NOTE — Progress Notes (Signed)
Patient ID: Crystal [name], female   DOB: 11-30-1980, 32 y.o.   MRN: [id number] She has been discharged and was picked up by a friend  ( former patient). She voiced understand ing of discharge instruction and of follow-up plan. Stated that she was going to a NA meeting at 5 PM today. She denies thoughts of SI and all belongings taken home with her.

## 2012-09-18 NOTE — [facility] Suicide Risk Assessment (Signed)
Suicide Risk Assessment  Discharge Assessment     Demographic Factors:  Caucasian and Living alone  Mental Status Per Nursing Assessment::   On Admission:     Current Mental Status by Physician: In full contact with reality. There are no suicidal ideas, plans or intent. She is willing and motivated to pursue long term abstinence and stability.    Loss Factors: NA  Historical Factors: NA  Risk Reduction Factors:   Responsible for children under 61 years of age, Sense of responsibility to family and Positive social support  Continued Clinical Symptoms:  Depression:   Comorbid alcohol abuse/dependence Alcohol/Substance Abuse/Dependencies  Cognitive Features That Contribute To Risk: Denies   Suicide Risk:  Minimal: No identifiable suicidal ideation.  Patients presenting with no risk factors but with morbid ruminations; may be classified as minimal risk based on the severity of the depressive symptoms  Discharge Diagnoses:   AXIS I:  Polysubstance Dependence, ADHD, Mood Disorder NOS AXIS II:  Deferred AXIS III:   Past Medical History  Diagnosis Date  . Mental disorder   . Asthma   . Headache   . Anemia    AXIS IV:  other psychosocial or environmental problems AXIS V:  61-70 mild symptoms  Plan Of Care/Follow-up recommendations:  Activity:  as tolerated Diet:  diet  Is patient on multiple antipsychotic therapies at discharge:  No   Has Patient had three or more failed trials of antipsychotic monotherapy by history:  No  Recommended Plan for Multiple Antipsychotic Therapies: N/A   Edana Aguado A 09/18/2012, 1:27 PM

## 2012-09-18 NOTE — Progress Notes (Signed)
In addition to follow up at [facility] for medication management patient was given schedule for NA in [place] and [place] areas along with requested information on the [facility] [address], [address] [employer] and [employer]. Patient had been referred to [facility] while here but no beds opened up and patient's options as [facility] Resident are limited for treatment referral.  Clide Dales 09/18/2012 10:57 AM

## 2012-09-23 NOTE — Progress Notes (Signed)
Patient Discharge Instructions:  After Visit Summary (AVS):   Faxed to:  09/23/12 Discharge Summary Note:   Faxed to:  09/23/12 Psychiatric Admission Assessment Note:   Faxed to:  09/23/12 Suicide Risk Assessment - Discharge Assessment:   Faxed to:  09/23/12 Faxed/Sent to the Next [facility] Care provider:  09/23/12 Faxed to [facility] @ [phone]  Jerelene Redden, 09/23/2012, 3:10 PM

## 2012-10-21 ENCOUNTER — Emergency Department: Payer: Self-pay | Admitting: Emergency Medicine

## 2012-10-21 LAB — URINALYSIS, COMPLETE
Nitrite: NEGATIVE
Ph: 7 (ref 4.5–8.0)
Protein: NEGATIVE
RBC,UR: 59 /HPF (ref 0–5)
Specific Gravity: 1.016 (ref 1.003–1.030)

## 2012-10-21 LAB — COMPREHENSIVE METABOLIC PANEL
Albumin: 3.5 g/dL (ref 3.4–5.0)
Alkaline Phosphatase: 89 U/L (ref 50–136)
Anion Gap: 9 (ref 7–16)
BUN: 7 mg/dL (ref 7–18)
Calcium, Total: 9 mg/dL (ref 8.5–10.1)
Chloride: 109 mmol/L — ABNORMAL HIGH (ref 98–107)
Co2: 21 mmol/L (ref 21–32)
EGFR (African American): >60
Osmolality: 276 (ref 275–301)
SGOT(AST): 95 U/L — ABNORMAL HIGH (ref 15–37)
SGPT (ALT): 111 U/L — ABNORMAL HIGH (ref 12–78)
Total Protein: 6.8 g/dL (ref 6.4–8.2)

## 2012-10-21 LAB — CBC
HGB: 12.9 g/dL (ref 12.0–16.0)
MCH: 29.8 pg (ref 26.0–34.0)
Platelet: 257 10*3/uL (ref 150–440)
WBC: 11.1 10*3/uL — ABNORMAL HIGH (ref 3.6–11.0)

## 2012-10-21 LAB — HCG, QUANTITATIVE, PREGNANCY: Beta Hcg, Quant.: 7410 m[IU]/mL — ABNORMAL HIGH

## 2012-12-01 ENCOUNTER — Emergency Department: Payer: Self-pay | Admitting: Emergency Medicine

## 2012-12-01 LAB — CBC
HCT: 30.7 % — ABNORMAL LOW (ref 35.0–47.0)
MCH: 24.9 pg — ABNORMAL LOW (ref 26.0–34.0)
MCHC: 32.6 g/dL (ref 32.0–36.0)
MCV: 76 fL — ABNORMAL LOW (ref 80–100)
Platelet: 558 10*3/uL — ABNORMAL HIGH (ref 150–440)
RBC: 4.01 10*6/uL (ref 3.80–5.20)
RDW: 17.6 % — ABNORMAL HIGH (ref 11.5–14.5)
WBC: 9.4 10*3/uL (ref 3.6–11.0)

## 2012-12-01 LAB — COMPREHENSIVE METABOLIC PANEL
Albumin: 4.2 g/dL (ref 3.4–5.0)
Alkaline Phosphatase: 98 U/L (ref 50–136)
BUN: 18 mg/dL (ref 7–18)
Calcium, Total: 9.1 mg/dL (ref 8.5–10.1)
Chloride: 102 mmol/L (ref 98–107)
Co2: 24 mmol/L (ref 21–32)
EGFR (African American): >60
Osmolality: 272 (ref 275–301)
SGOT(AST): 103 U/L — ABNORMAL HIGH (ref 15–37)
SGPT (ALT): 105 U/L — ABNORMAL HIGH (ref 12–78)
Sodium: 133 mmol/L — ABNORMAL LOW (ref 136–145)
Total Protein: 8.4 g/dL — ABNORMAL HIGH (ref 6.4–8.2)

## 2012-12-01 LAB — URINALYSIS, COMPLETE
Bilirubin,UR: NEGATIVE
Hyaline Cast: 20
Leukocyte Esterase: NEGATIVE
Ph: 5 (ref 4.5–8.0)
Protein: 30
Specific Gravity: 1.025 (ref 1.003–1.030)
WBC UR: 8 /HPF (ref 0–5)

## 2012-12-01 LAB — LIPASE, BLOOD: Lipase: 42 U/L — ABNORMAL LOW (ref 73–393)

## 2012-12-01 LAB — APTT: Activated PTT: 25.2 secs (ref 23.6–35.9)

## 2012-12-31 ENCOUNTER — Ambulatory Visit: Payer: Self-pay | Admitting: Obstetrics and Gynecology

## 2012-12-31 LAB — COMPREHENSIVE METABOLIC PANEL
Albumin: 3.3 g/dL — ABNORMAL LOW (ref 3.4–5.0)
Alkaline Phosphatase: 109 U/L (ref 50–136)
Anion Gap: 4 — ABNORMAL LOW (ref 7–16)
Chloride: 108 mmol/L — ABNORMAL HIGH (ref 98–107)
Co2: 29 mmol/L (ref 21–32)
Glucose: 75 mg/dL (ref 65–99)
Osmolality: 279 (ref 275–301)
SGOT(AST): 193 U/L — ABNORMAL HIGH (ref 15–37)
SGPT (ALT): 220 U/L — ABNORMAL HIGH (ref 12–78)
Sodium: 141 mmol/L (ref 136–145)

## 2012-12-31 LAB — CBC
HCT: 26.6 % — ABNORMAL LOW (ref 35.0–47.0)
MCHC: 30.5 g/dL — ABNORMAL LOW (ref 32.0–36.0)
MCV: 70 fL — ABNORMAL LOW (ref 80–100)
Platelet: 312 10*3/uL (ref 150–440)

## 2012-12-31 LAB — HCG, QUANTITATIVE, PREGNANCY: Beta Hcg, Quant.: <1 m[IU]/mL — ABNORMAL LOW

## 2012-12-31 LAB — DRUG SCREEN, URINE
Cannabinoid 50 Ng, Ur ~~LOC~~: NEGATIVE (ref ?–50)
Opiate, Ur Screen: NEGATIVE (ref ?–300)

## 2013-01-07 ENCOUNTER — Ambulatory Visit: Payer: Self-pay | Admitting: Obstetrics and Gynecology

## 2013-01-07 LAB — DRUG SCREEN, URINE
Barbiturates, Ur Screen: NEGATIVE (ref ?–200)
Benzodiazepine, Ur Scrn: NEGATIVE (ref ?–200)
Opiate, Ur Screen: NEGATIVE (ref ?–300)
Tricyclic, Ur Screen: NEGATIVE (ref ?–1000)

## 2013-01-08 LAB — PATHOLOGY REPORT

## 2013-01-16 ENCOUNTER — Ambulatory Visit: Payer: Self-pay | Admitting: Internal Medicine

## 2013-06-29 IMAGING — CR DG CHEST 1V PORT
1 series · 1 of 1 positions shown · non-contrast
Comparison: none

REASON FOR EXAM: AMS
COMMENTS:

PROCEDURE:     DXR - DXR PORTABLE CHEST SINGLE VIEW  - October 27, 2011  [DATE]
RESULT:     Comparison: 04/16/2011

[portable]
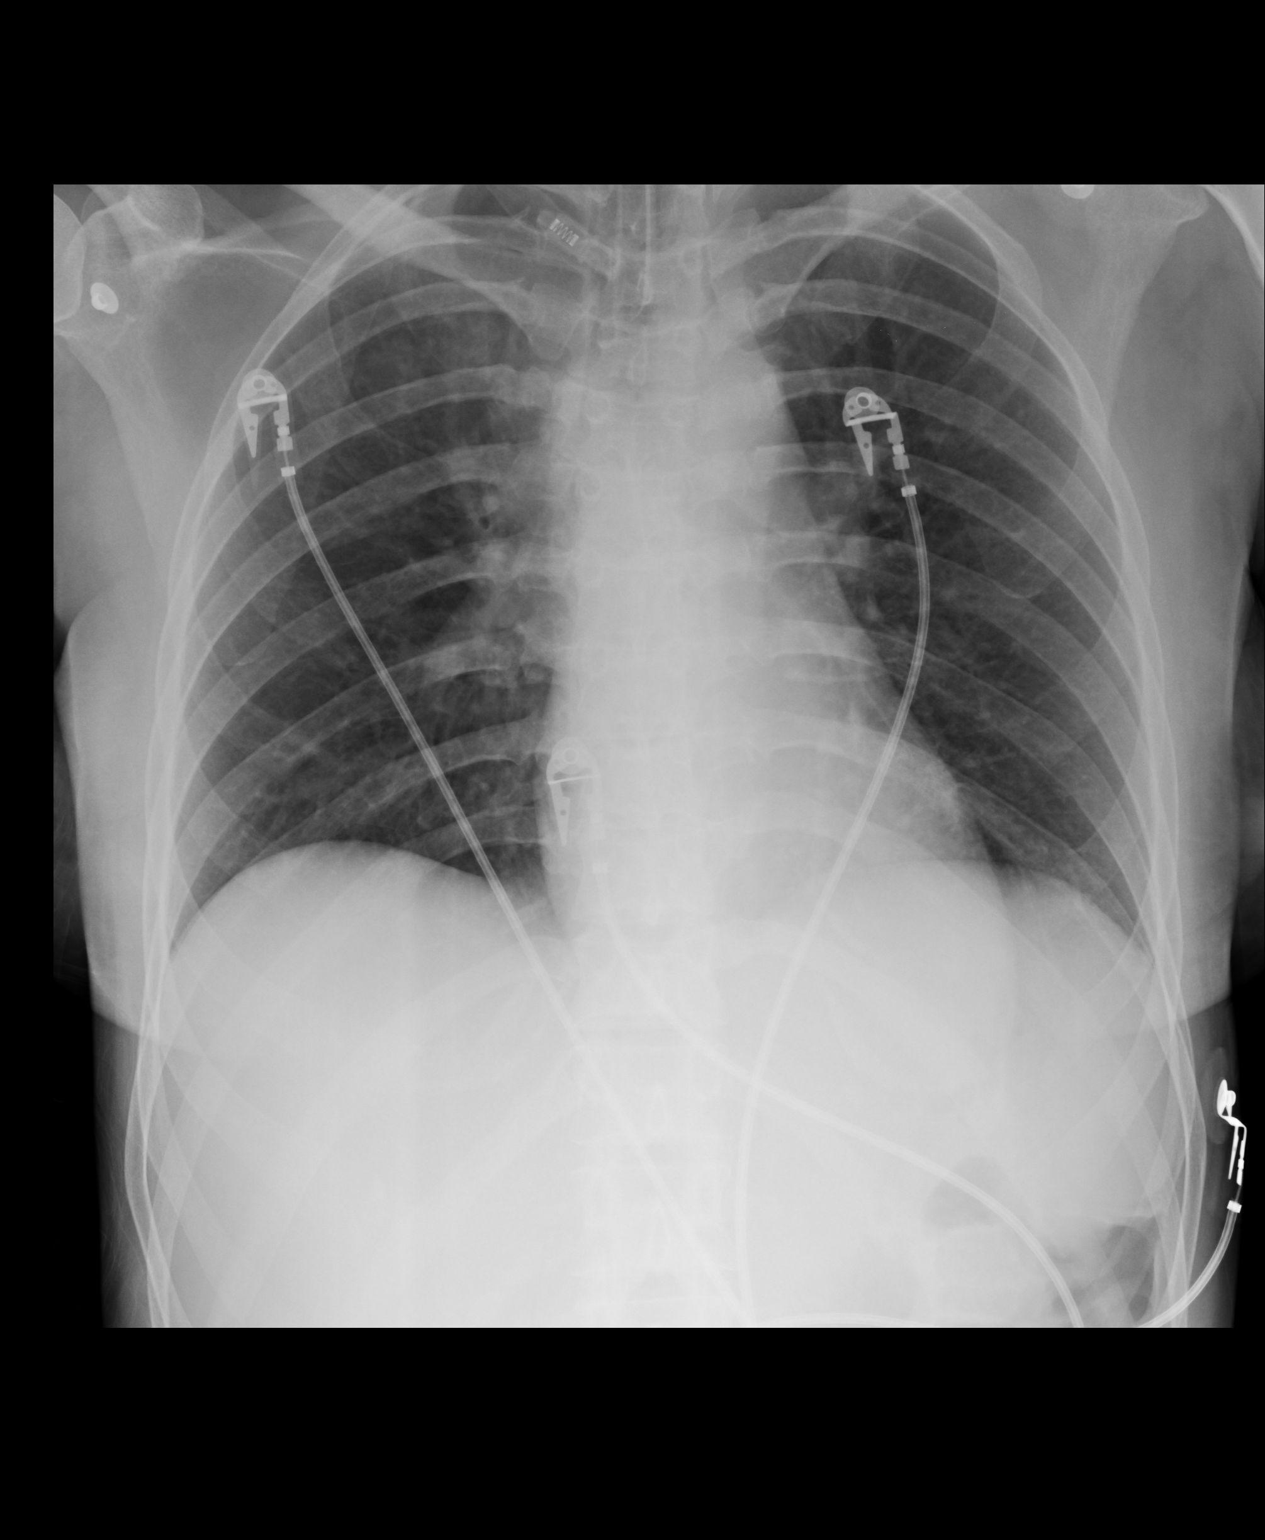

[1 of 1 positions shown; findings below may reference images not displayed]

FINDINGS: The endotracheal tube terminates just below the thoracic inlet. Heart is
normal in size. Minimal basilar opacities are likely secondary to
atelectasis.
IMPRESSION: Endotracheal tube just below the thoracic inlet.

## 2013-11-12 IMAGING — US ABDOMEN ULTRASOUND LIMITED
1 series · 14 of 25 positions shown · non-contrast
Comparison: none

REASON FOR EXAM: Hepatitis
COMMENTS:

PROCEDURE:     DEION - DEION ABDOMEN UPPER GENERAL  - March 11, 2012 [DATE]
RESULT:     Comparison: None.
TECHNIQUE: Multiple grayscale and color Doppler images were obtained of the
abdomen.

[Series 1: abdomen ultrasound limited · 0.28mm/px · 14 of 89 slices shown]
[im 1/89]
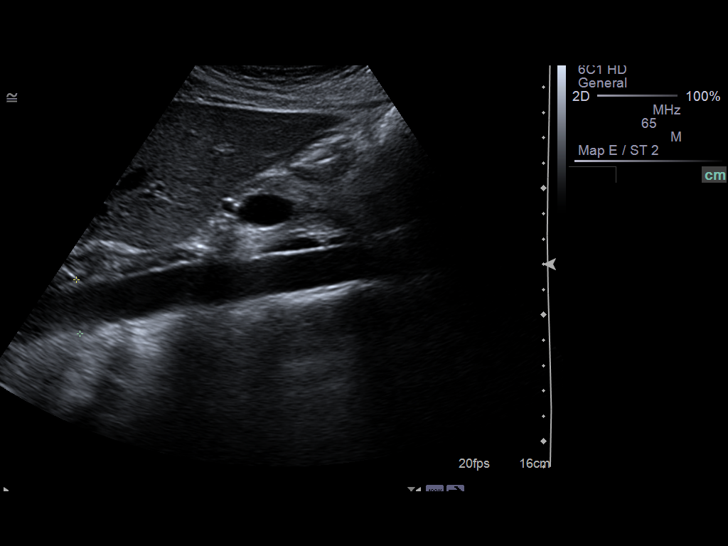
[im 8/89]
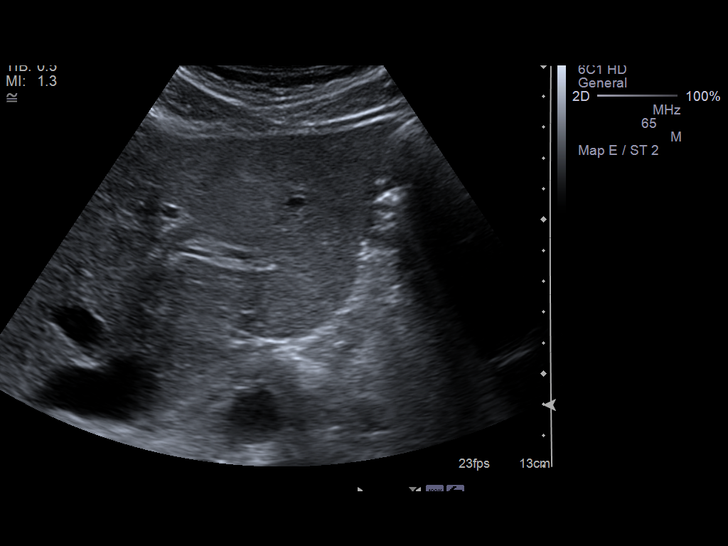
[im 15/89]
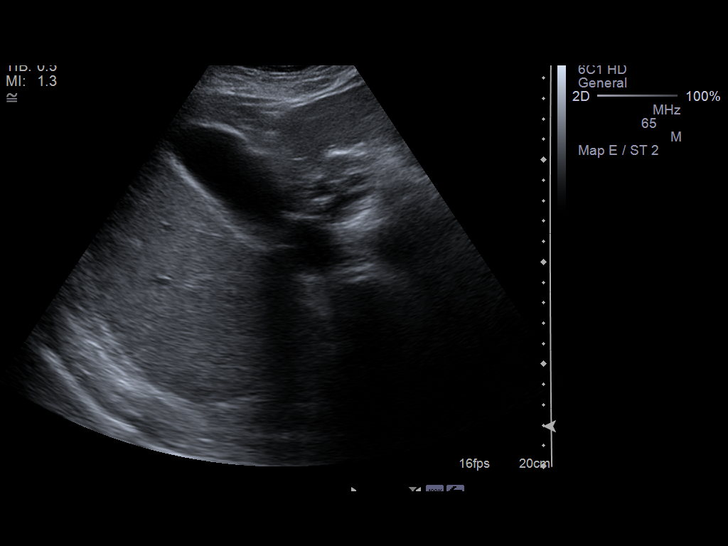
[im 23/89]
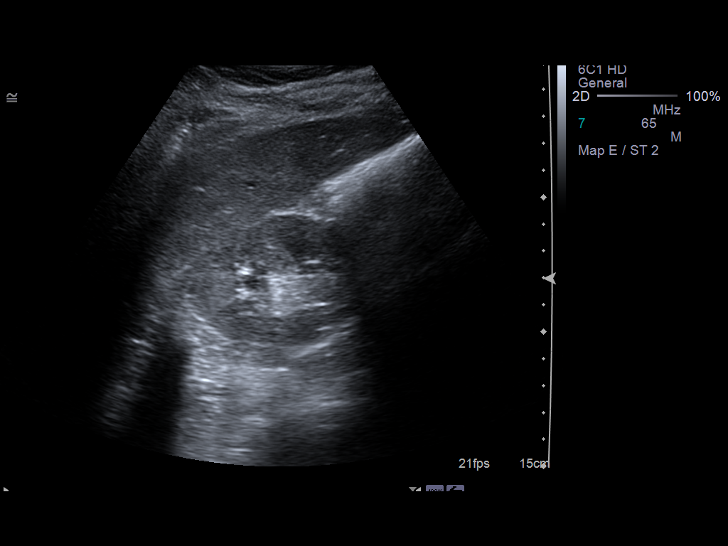
[im 30/89]
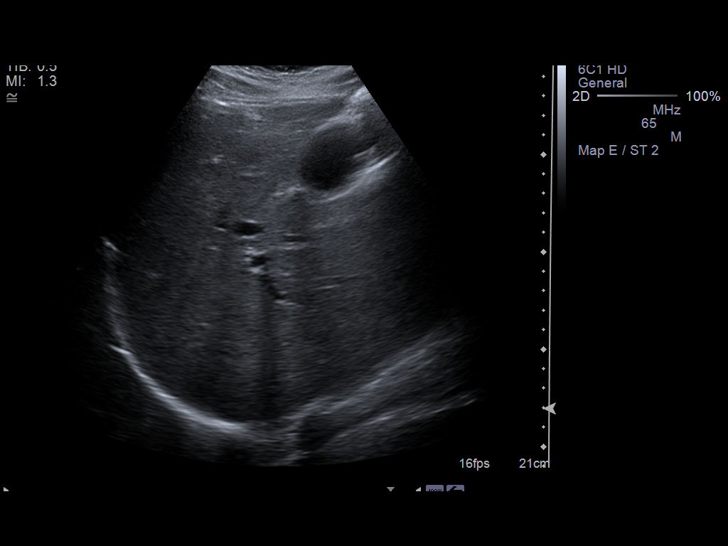
[im 34/89]
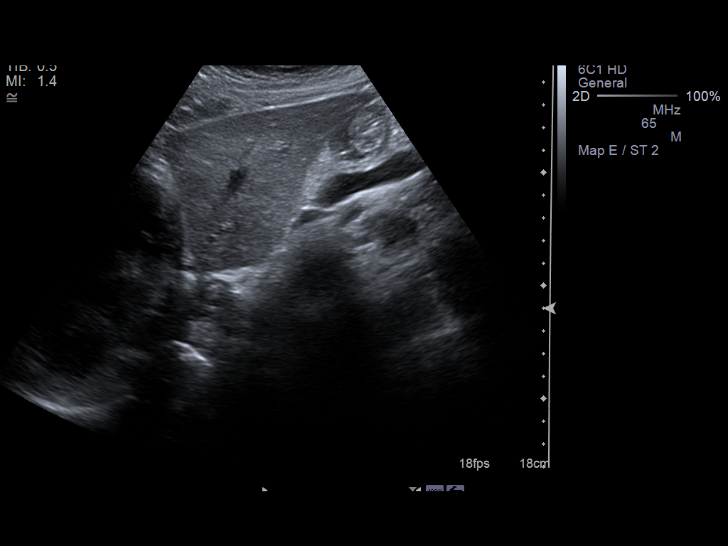
[im 41/89]
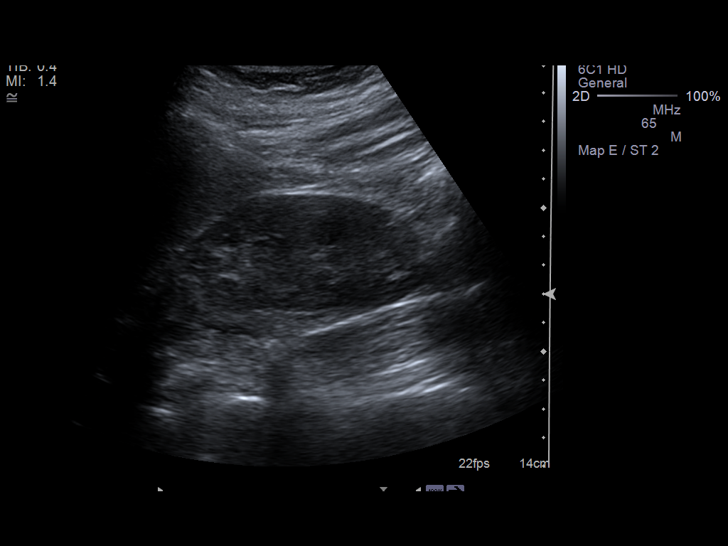
[im 48/89]
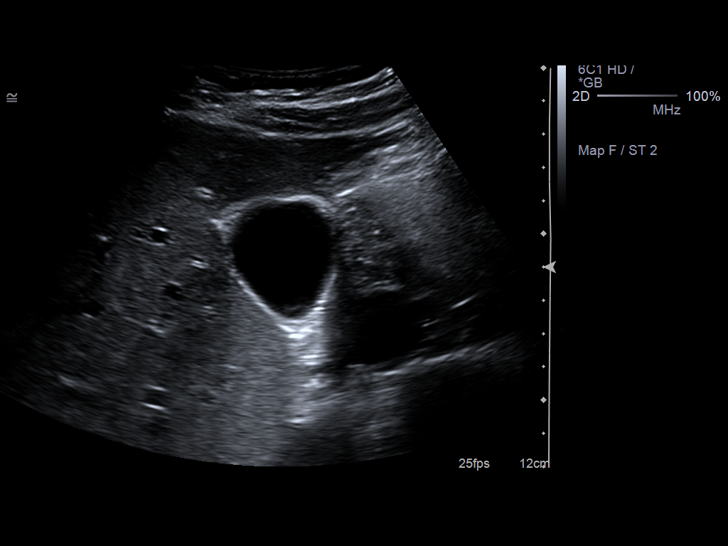
[im 56/89]
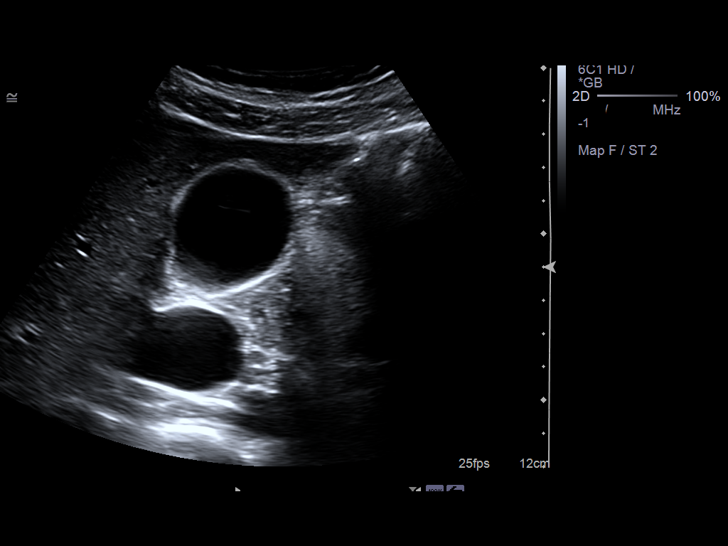
[im 59/89]
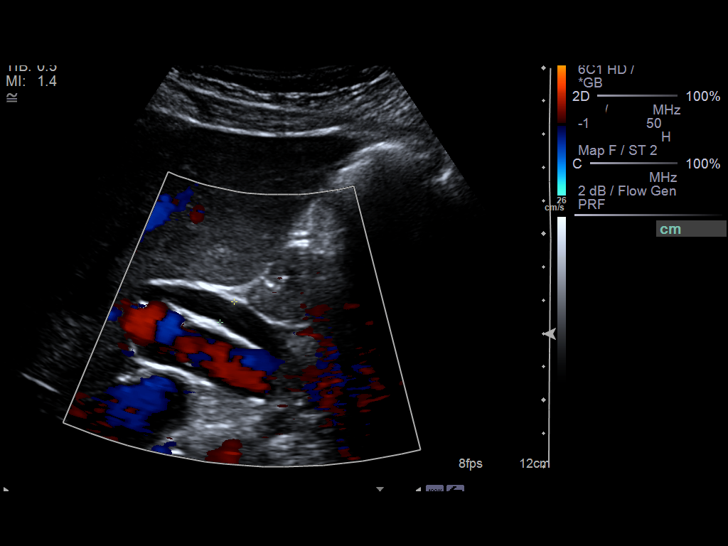
[im 67/89]
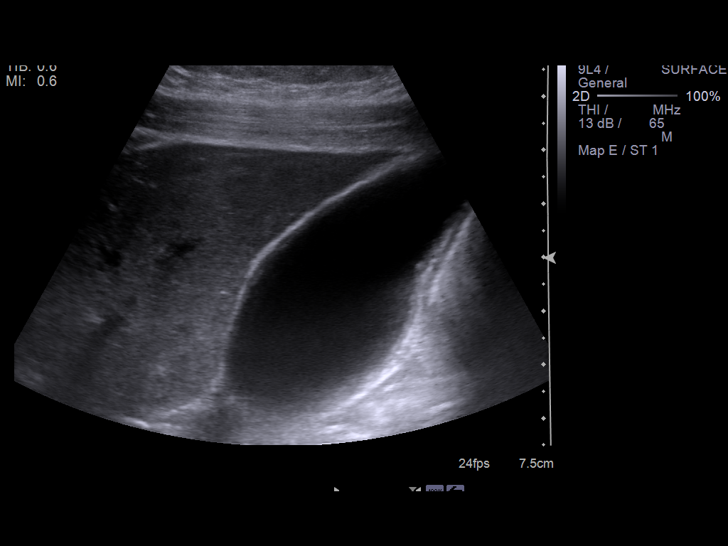
[im 74/89]
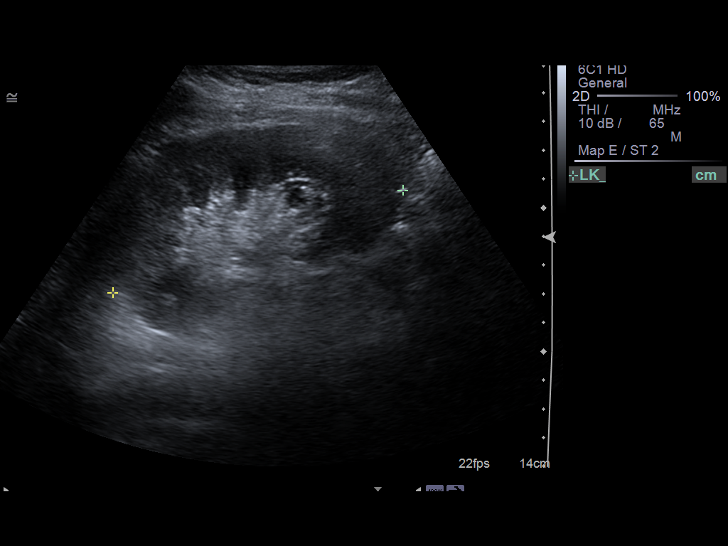
[im 81/89]
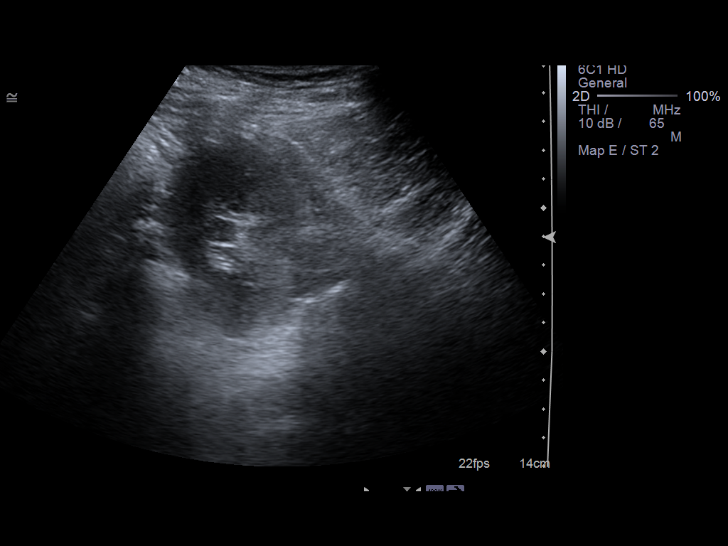
[im 89/89]
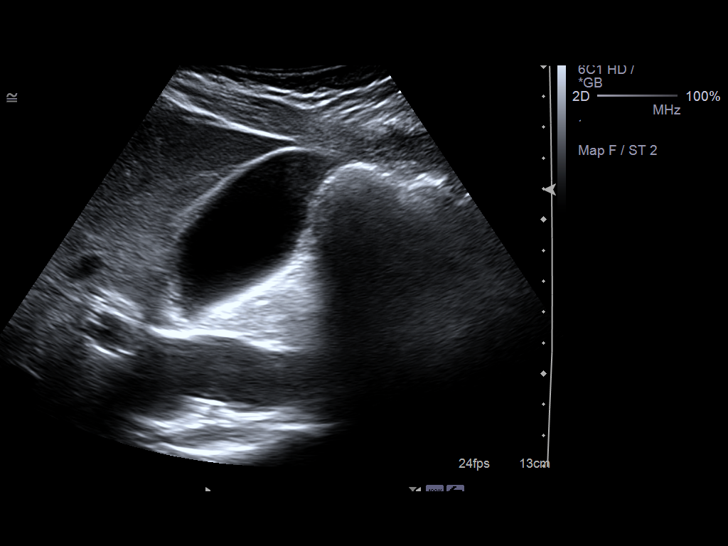

[14 of 25 positions shown; findings below may reference images not displayed]

FINDINGS: The visualized liver spleen are unremarkable. The gallbladder is normal.
Sonographic Murphy sign was negative. The common bile duct measures 7-8 mm.
The pancreas was partially obscured by overlying bowel gas. The visualized
portion of the pancreatic body and head are unremarkable.

Images of the kidneys showed no hydronephrosis.
IMPRESSION: 1. Normal gallbladder.
2. The common bile duct is mildly dilated, measuring 7-8 mm in diameter. The
etiology for this dilatation is not identified on this study. Further
evaluation could provided with M.R.C.P. or ERCP, as indicated.

[REDACTED]

## 2014-05-14 IMAGING — US US OB < 14 WEEKS - US OB TV
1 series · 13 of 28 positions shown · non-contrast
Comparison: none

REASON FOR EXAM: + pregnancy, abdominal discomfort
COMMENTS:

PROCEDURE:     US  - US OB LESS THAN 14 WEEKS/W TRANS  - September 10, 2012  [DATE]
RESULT:     Comparison: None.
TECHNIQUE: Multiple grayscale and color Doppler images were obtained of the
pelvis via transabdominal and endovaginal sound.

[Series 1: us ob < 14 weeks - us ob tv · 0.23mm/px · 13 of 100 slices shown]
[im 4/100]
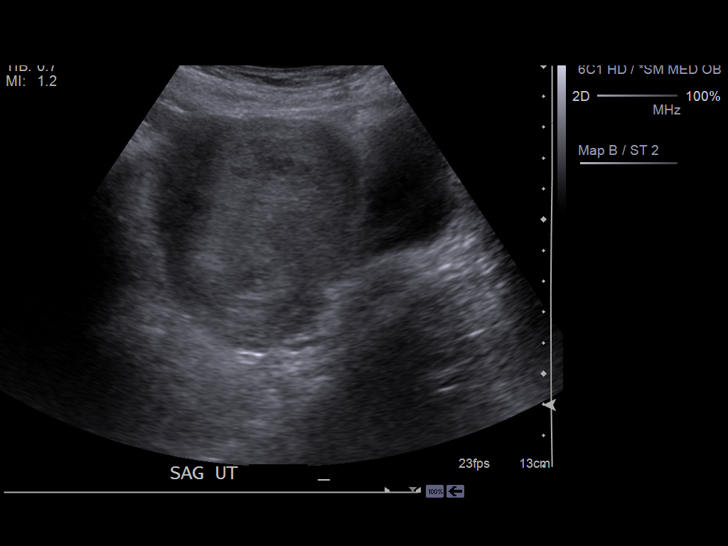
[im 12/100]
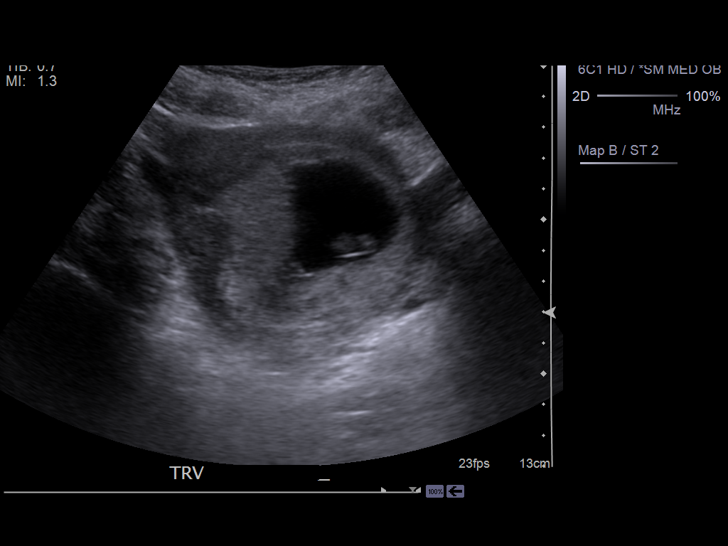
[im 19/100]
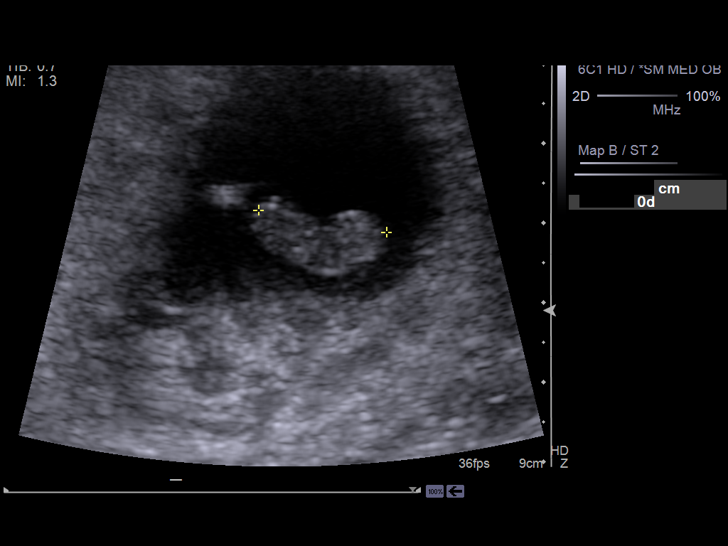
[im 26/100]
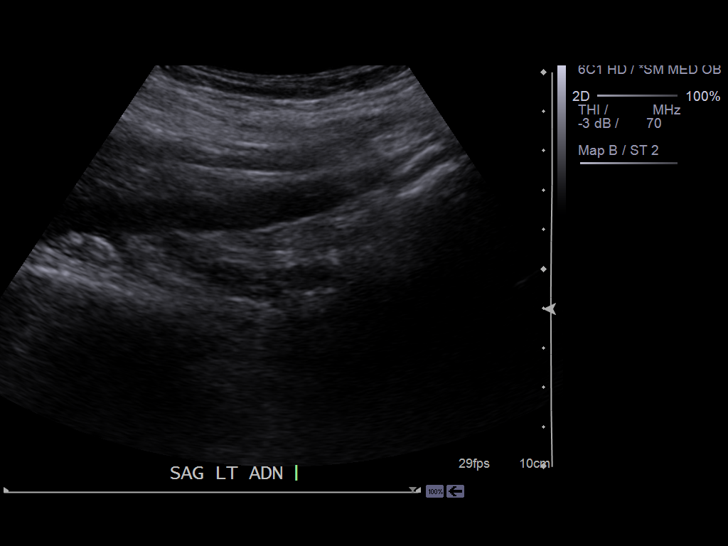
[im 34/100]
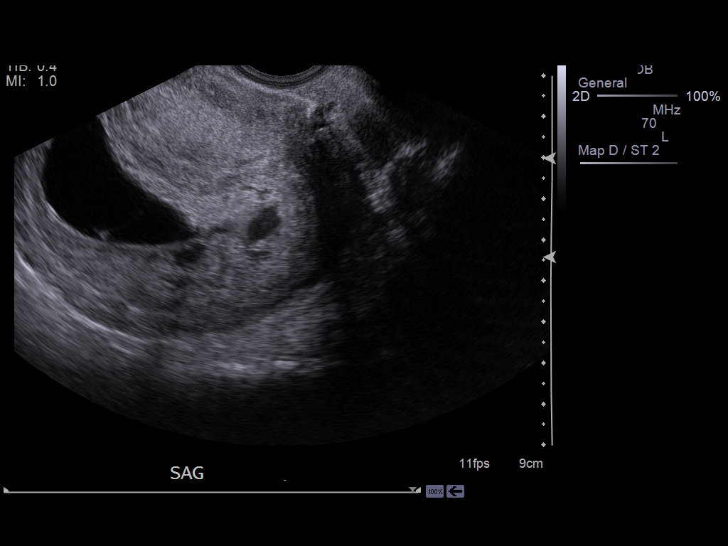
[im 41/100]
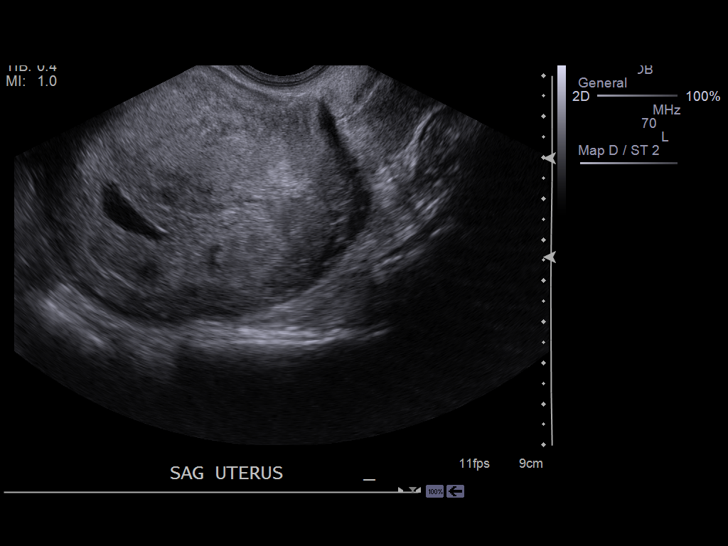
[im 52/100]
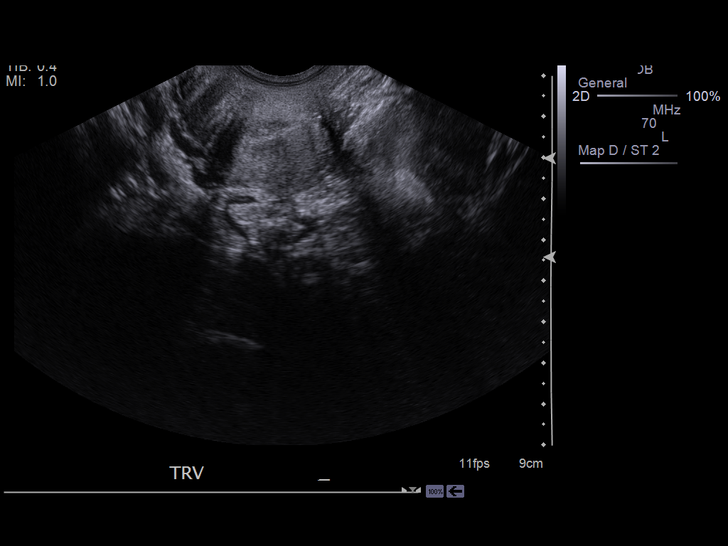
[im 59/100]
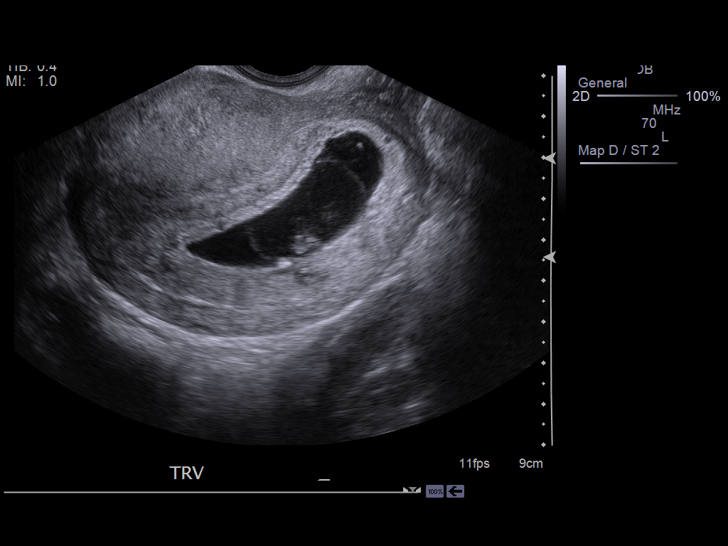
[im 67/100]
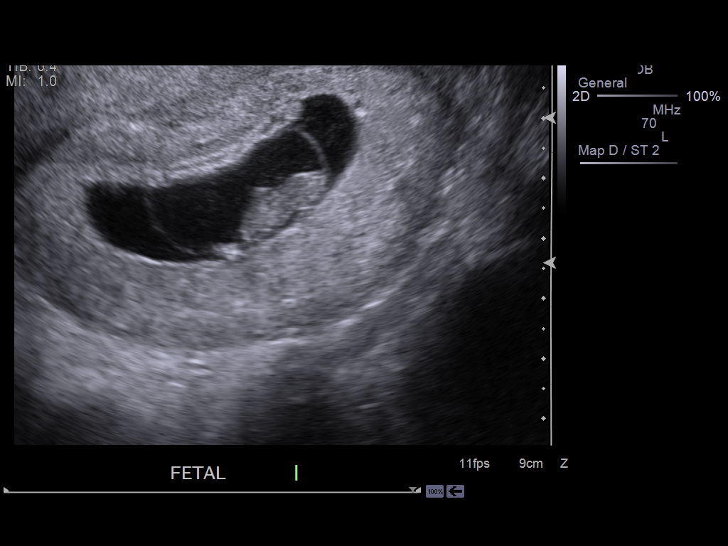
[im 74/100]
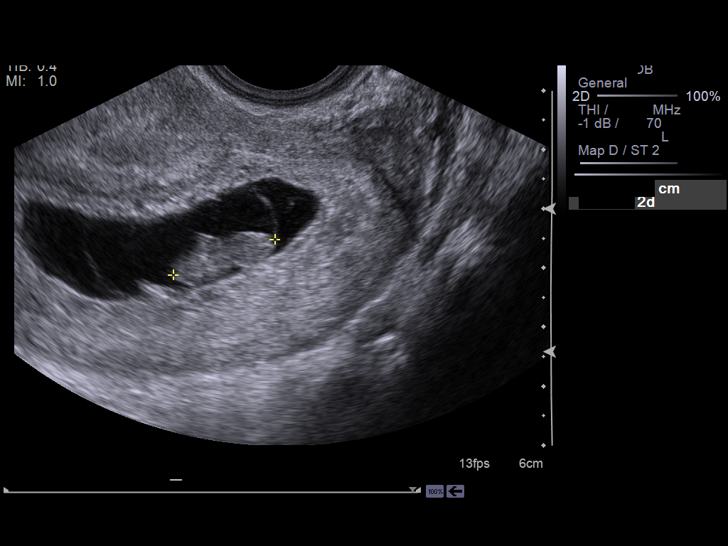
[im 81/100]
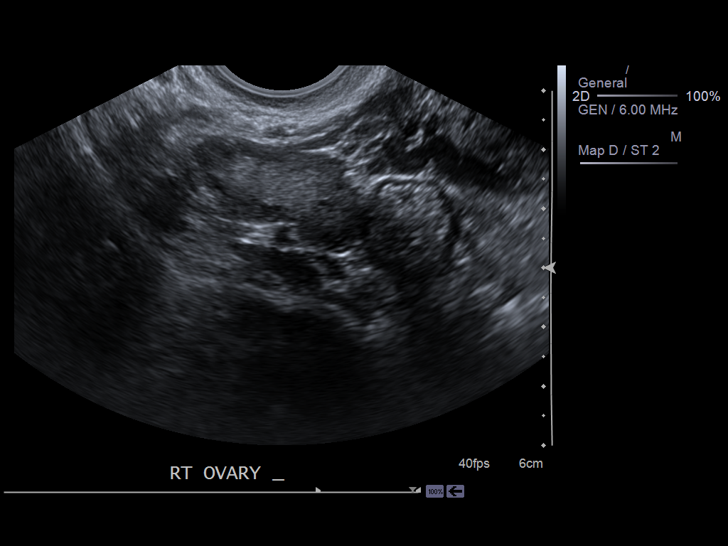
[im 89/100]
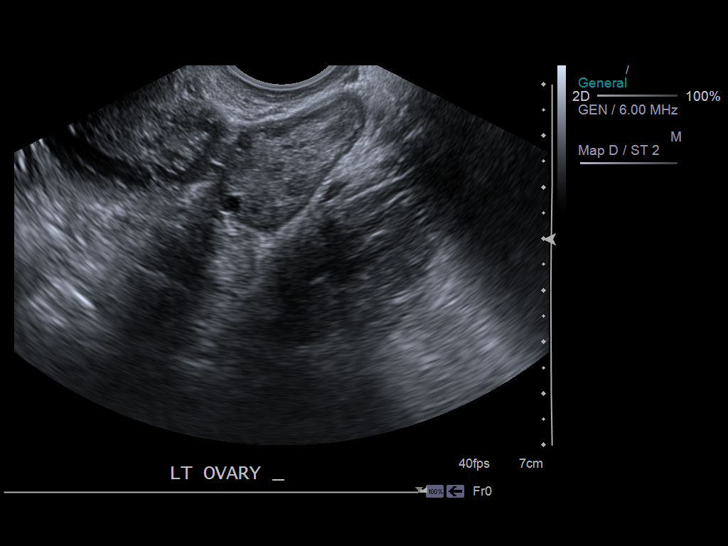
[im 96/100]
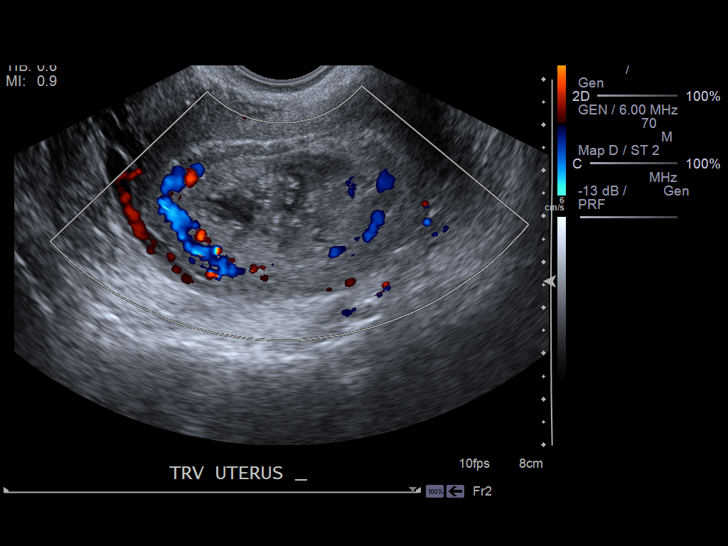

[13 of 28 positions shown; findings below may reference images not displayed]

FINDINGS: There is a gestational sac within the uterus. There is a fetal pole with a
crown-rump length of 1.73 cm. This correlates with estimated gestational age
of 8 weeks 1 days. No fetal heart rate was identified. The size of the
gestational sac correlates with estimated gestational age of 10 weeks 5
days. Curvilinear echogenic signal surrounding the fetal pole could
represent the amnion.

The right ovary measures 3.2 x 1.3 x 1.3 cm. The left ovary measures 3.5 x
2.3 x 1.4 cm. Color Doppler flow is associated with the bilateral ovaries.
Spectral Doppler imaging was not performed. No adnexal mass identified.
IMPRESSION: There is a fetal pole and gestational sac within the year is. However, no
fetal heart rate was identified. Given the size of the fetal pole, the
findings are concerning for failed intrauterine pregnancy. Additionally,
there are findings which may represent some separation of the amnion.
Clinical followup and followup beta hCGs are recommended. Followup pelvic
ultrasound could be performed as indicated.

[REDACTED]

## 2014-07-05 ENCOUNTER — Inpatient Hospital Stay: Payer: Self-pay | Admitting: Obstetrics & Gynecology

## 2014-07-05 LAB — DRUG SCREEN, URINE
AMPHETAMINES, UR SCREEN: NEGATIVE (ref ?–1000)
Barbiturates, Ur Screen: NEGATIVE (ref ?–200)
Benzodiazepine, Ur Scrn: NEGATIVE (ref ?–200)
Cannabinoid 50 Ng, Ur ~~LOC~~: NEGATIVE (ref ?–50)
Cocaine Metabolite,Ur ~~LOC~~: POSITIVE (ref ?–300)
MDMA (ECSTASY) UR SCREEN: NEGATIVE (ref ?–500)
Methadone, Ur Screen: NEGATIVE (ref ?–300)
Opiate, Ur Screen: NEGATIVE (ref ?–300)
Phencyclidine (PCP) Ur S: NEGATIVE (ref ?–25)
Tricyclic, Ur Screen: NEGATIVE (ref ?–1000)

## 2014-07-05 LAB — CBC WITH DIFFERENTIAL/PLATELET
Basophil #: 0.1 10*3/uL (ref 0.0–0.1)
Basophil %: 0.9 %
EOS PCT: 0.7 %
Eosinophil #: 0 10*3/uL (ref 0.0–0.7)
HCT: 39.2 % (ref 35.0–47.0)
HGB: 12.8 g/dL (ref 12.0–16.0)
LYMPHS ABS: 1.5 10*3/uL (ref 1.0–3.6)
Lymphocyte %: 23 %
MCH: 26.7 pg (ref 26.0–34.0)
MCHC: 32.6 g/dL (ref 32.0–36.0)
MCV: 82 fL (ref 80–100)
MONOS PCT: 6.6 %
Monocyte #: 0.4 x10 3/mm (ref 0.2–0.9)
NEUTROS ABS: 4.5 10*3/uL (ref 1.4–6.5)
Neutrophil %: 68.8 %
Platelet: 279 10*3/uL (ref 150–440)
RBC: 4.79 10*6/uL (ref 3.80–5.20)
RDW: 14.1 % (ref 11.5–14.5)
WBC: 6.5 10*3/uL (ref 3.6–11.0)

## 2014-07-06 LAB — HEMATOCRIT: HCT: 30.3 % — ABNORMAL LOW (ref 35.0–47.0)

## 2014-10-20 NOTE — Op Note (Signed)
PATIENT NAME:  Crystal [name], Crystal [name] MR#:  [id number] DATE OF BIRTH:  [birth year]  DATE OF PROCEDURE:  04/15/2012  PREOPERATIVE DIAGNOSIS: Malunion intraarticular fracture, distal phalanx right ring finger.   POSTOPERATIVE DIAGNOSIS: Malunion intraarticular fracture, distal phalanx right ring finger.   PROCEDURE: Osteotomy of malunion distal phalanx right ring finger with open reduction and internal fixation.   SURGEON: Myra Rudehristopher Harrie Cazarez, M.D.   ANESTHESIA: General.   COMPLICATIONS: None.   TOURNIQUET TIME: Approximately 60 minutes.   PROCEDURE:  One gram of Ancef was given intravenously prior to the procedure. General anesthesia was induced. The right upper extremity is thoroughly prepped with alcohol and ChloraPrep and draped in standard sterile fashion. The extremity is wrapped out with the Esmarch bandage and pneumatic tourniquet elevated to 300 mmHg. Under loupe magnification, a standard dorsal curved incision is made over the DIP joint and the skin flaps elevated off of the extensor tendon. The extensor tendon was then cut off of the bone at its most distal portion and reflected proximally. The dorsal chip of bone is removed using a periosteal elevator. The subluxation volarly of the distal phalanx on the middle phalanx is then reduced and pinned longitudinally with a 0.62 C-wire. The wound is thoroughly irrigated multiple times. The extensor tendon is then sewed back down to the dorsal aspect of the articular surface using 4 - 0 Mersilene. The pin at the tip is cut and buried beneath the skin and that wound is closed with 4-0 nylon. The dorsal skin wound is closed with 4-0 nylon. A soft bulky dressing is applied. The tourniquet is released.     The patient is returned to the recovery room in satisfactory condition having tolerated the procedure quite well.     ____________________________ Clare Gandyhristopher E. Myron Lona, MD ces:bjt D: 04/15/2012 15:25:50 ET T: 04/15/2012 16:01:18  ET JOB#: [id number]  cc: Clare Gandyhristopher E. Chet Greenley, MD, <Dictator> Clare GandyHRISTOPHER E Makyia Erxleben MD ELECTRONICALLY SIGNED 04/15/2012 17:30

## 2014-10-23 NOTE — Op Note (Signed)
PATIENT NAME:  Crystal [name], Crystal [name] MR#:  [id number] DATE OF BIRTH:  1981/01/08  DATE OF PROCEDURE:  01/07/2013  PREOPERATIVE DIAGNOSES: 1.  Abnormal uterine bleeding.  2.  Chronic blood loss anemia.  3.  Recent spontaneous abortion.   POSTOPERATIVE DIAGNOSES:  1.  Abnormal uterine bleeding.  2.  Chronic blood loss anemia.  3.  Recent spontaneous abortion.   PROCEDURE: Dilation and curettage.  SURGEON: Prentice Docker, M.D.   ANESTHESIA:  General.   ESTIMATED BLOOD LOSS: 25 mL.  OPERATIVE FLUIDS: 500 mL crystalloid.   COMPLICATIONS: None.   FINDINGS: 1.  Uterus with apparent normal size and contour.  2.  Uterine sound depth of 7 cm.  3.  Medium amount of endometrial curettings.   SPECIMENS: Endometrial curettings.   CONDITION: Stable at the end of procedure.   INDICATION FOR PROCEDURE: Crystal [name] is a 34 year old female who presented to my clinic about 2 months ago with approximately 1 to 2 month history of nonstop bleeding after a positive pregnancy test. She had presented to the Emergency Room on several occasions for various reasons with a positive pregnancy test and vaginal bleeding with no apparent intrauterine pregnancy. Her quantitative hCG levels have been falling steadily, however, she has had a significant drop in her blood counts over the past couple of months with no stop in her bleeding. She did have a negative quantitative hCG on her preoperative date, which was beginning of July, but continued to have bleeding up until just a few days ago. After discussion with the patient, we decided to proceed with at least a sampling of the lining of her uterus given the overall situation. She was therefore taken to the operating room.   PROCEDURE IN DETAIL: After the patient was met in the preoperative area, and the need for the procedure and the procedure specifics were discussed in detail, and her questions were answered, she was taken to the operating room. She was placed under  general anesthesia and placed in the dorsal supine high lithotomy position and prepped and draped in the usual sterile fashion. After timeout was called, a red rubber catheter was placed through her urethra into her bladder and her bladder was drained of about 100 mL clear urine. A sterile speculum was placed in the vagina and a single-tooth tenaculum was placed on the anterior lip of the cervix. The uterus was sounded to a depth of approximately 7 cm.  The cervix was then gently dilated using Hegar dilators to a dilatation of approximately 7 mm.  Using a small blunt curette, sampling of the entire uterine cavity was taken with a medium amount of tissue returning with no significant blood loss noted. The procedure at this point was terminated. The single-tooth tenaculum was removed and silver nitrate was used at the puncture sites on her cervix where the tenaculum was placed in order to obtain hemostasis, and the speculum and any other instrumentation in the vagina was then removed and verified to be removed from her vagina at the end of procedure.   The patient tolerated the procedure well. Sponge, lap and needle counts were correct x 2. She received doxycycline 100 mg IV prior to going to the operating room for antibiotic prophylaxis giving her unclear history and reason for bleeding. She was awakened in the operating room and taken to the recovery area in stable condition. ____________________________ Will Bonnet, MD sdj:sb D: 01/07/2013 14:97:02 ET T: 01/07/2013 10:29:14 ET JOB#: [id number]  cc: Will Bonnet, MD, <Dictator>  Will Bonnet MD ELECTRONICALLY SIGNED 01/28/2013 12:49

## 2014-10-25 NOTE — Consult Note (Signed)
Brief Consult Note: Diagnosis: Mood disorder NOS, cocaine abuse.   Patient was seen by consultant.   Consult note dictated.   Recommend further assessment or treatment.   Orders entered.   Discussed with Attending MD.   Comments: [name] has a h/o depression and anxiety. She OD on medications while arguing with the BF. She is no longer suicidal. She complains of insomnia and OCD type of symptoms. There is skin picking.  PLAN: 1. The pt no longer suicidal. She is able to CFS. I will d/c sitter.   2. Will start Ambien for sleep, SSRI for  anxiety, Risperdal for mood stabilization.   3. She minimizes substance abuse problems.   4. I will follow up.  Electronic Signatures: Kristine LineaPucilowska, Caliph Borowiak (MD)  (Signed 29-Apr-13 16:08)  Authored: Brief Consult Note   Last Updated: 29-Apr-13 16:08 by Kristine LineaPucilowska, Shealee Yordy (MD)

## 2014-10-25 NOTE — Consult Note (Signed)
Brief Consult Note: Diagnosis: Mood disorder NOS, cocaine abuse.   Patient was seen by consultant.   Consult note dictated.   Recommend further assessment or treatment.   Orders entered.   Discussed with Attending MD.   Comments: [name] has a h/o depression and anxiety. She OD on medications while arguing with the BF. She is no longer suicidal. She complains of insomnia and OCD type of symptoms. There is skin picking.   She was started on Prozac and Risperdal for anxiety/mood stabilization. She remembers today that she was diagnosed with bipolar and treated with Lithium. She became toxic on Tegretol. She gained weight on Risperdal. She wants to try Tegretol again. There are concerns about weight gain.   MSE: Alert, oriented, pleasant, cooperative. No suicidal/homicidal ideation, no psychosis.   PLAN: 1. The pt is no longer suicidal. She is able to CFS. She does not require admission to BMU. She [name] at 14:00 at [facility] [phone].   2. We will continue Prozac for anxiety, stop Risperdal and started Tegretol for mood stabilization.   3. She minimizes substance abuse problems.   4. I will follow up.  Electronic Signatures: Crystal [name], Crystal [name] (MD)  (Signed 30-Apr-13 16:03)  Authored: Brief Consult Note   Last Updated: 30-Apr-13 16:03 by Crystal [name], Crystal [name] (MD)

## 2014-10-25 NOTE — Consult Note (Signed)
PATIENT NAME:  Crystal [name], Crystal [name]#:  [id number] DATE OF BIRTH:  1981-03-01  DATE OF CONSULTATION:  10/30/2011  REFERRING PHYSICIAN:   Alford Highlandichard Wieting, MD CONSULTING PHYSICIAN:  Edger Husain B. Arlys Scatena, MD  REASON FOR CONSULTATION: To evaluate patient after a suicide attempt.   IDENTIFYING DATA: [name] is a 34 year old female with history of bipolar illness and chronic pain.   CHIEF COMPLAINT: "I was not suicidal."   HISTORY OF PRESENT ILLNESS: [name] reports that on the day of admission she was arguing with her boyfriend. She took some extra medication to get to sleep. She adamantly denies intention to hurt herself. The boyfriend noticed in the middle of the night that the patient was incoherent. She fell out of bed, was falling down when trying to walk, and eventually was unresponsive. She was brought to the hospital with respiratory failure. She was positive for amphetamines, cocaine, and opiates. The patient adamantly denies a suicide attempt but she also is unable to explain her actions. She reports mood instability with frequent mood swings. She has periods of time when she is not interested in getting out of bed or talking to anybody, and then periods of time when she is too active, happy. When she is depressed her back pain is unbearable. When she is slightly manic she does not have as many problems with pain. She has been taking pain medication for an extended period of time. She was hospitalized in 2010. During this past hospitalization she did admit to using medication inappropriately. She denies using substances, even though she is positive for cocaine.   PAST PSYCHIATRIC HISTORY: She was hospitalized once before. There were no suicide attempts. She was diagnosed with bipolar. She was tried on lithium, Lamictal, Tegretol, Seroquel, Risperdal, and Abilify. She reports that she became toxic on Tegretol. She gained weight on Seroquel. She is somewhat interested in treatment of bipolar  illness. She also endorses symptoms of anxiety with excessive worries, cleaning, and organizing. I also noticed that she is picking her skin.  She feels that she is more anxious as she has no access to her regular medications and cigarettes. Again. she denies drinking or substance use.   FAMILY PSYCHIATRIC HISTORY: None reported.  MEDICAL HISTORY:  Asthma.  Chronic pain.   ALLERGIES: BuSpar, Lexapro, Nubain, Tylenol, Zanaflex.   MEDICATIONS ON ADMISSION: Neurontin 600 mg twice daily.   MEDICATIONS AT THE TIME OF CONSULTATION:  1. Heparin injection. 2. Nicotine patch. 3. Zantac 150 mg daily. 4. Levaquin 500 mg IV.  5. Clonazepam 0.5 mg every eight hours. 6. Zofran as needed.  7. OxyContin 20 mg q. 6 hours as needed. 8. Phenergan injection as needed.  9. Dilaudid 1 to 2 mg as needed.  10. Albuterol as needed.   SOCIAL HISTORY: She lives currently with her boyfriend. She denies that this is an abusive situation, but they have been arguing a lot.   REVIEW OF SYSTEMS: CONSTITUTIONAL: No fevers or chills. No weight changes. EYES: No double or blurred vision. ENT: No hearing loss. RESPIRATORY: Positive for occasional shortness of breath. CARDIOVASCULAR: No chest pain or orthopnea. GASTROINTESTINAL: Positive for abdominal pain and vomiting. GU: No incontinence or frequency. ENDOCRINE: No heat or cold intolerance. LYMPHATIC: No anemia or easy bruising. INTEGUMENT: No acne or rash. MUSCULOSKELETAL: Positive for back pain and history of carpal tunnel syndrome. NEUROLOGIC: No tingling or weakness. PSYCHIATRIC: See history of present illness for details.   PHYSICAL EXAMINATION:  VITAL SIGNS: Blood pressure 139/73, pulse 68, respirations 20,  temperature 98.7.   GENERAL: This is a slender female in no acute distress, but vomiting. The rest of the physical examination is deferred to her primary attending.   LABORATORY, DIAGNOSTIC, AND RADIOLOGICAL DATA: Chemistries are within normal limits except for  blood glucose of 123, potassium 3.1. LFTs within normal limits except for ALT of 86. Urine tox screen positive for amphetamines, cocaine, and opiates. CBC within normal limits. Urinalysis is suggestive of urinary tract infection with Escherichia coli, sensitive to all antibiotics. Serum acetaminophen and salicylates are low. Urine pregnancy test is negative.   EKG: Normal sinus rhythm, normal EKG.   MENTAL STATUS EXAMINATION: The patient is alert and oriented to person, place, and time.  She does not remember details leading to her admission. She is unwilling to discuss the role of substances in her attempt or maybe bringing about arguments with the boyfriend. She is pleasant, polite, and cooperative. She recognizes me from previous admission and also from our encounter at the mental health center many years ago. She is wearing a hospital gown. She maintains good eye contact. Her speech is soft. Mood is fine with flat affect. Thought processing is logical and goal oriented. Thought content: She denies suicidal or homicidal ideation but was admitted after a suicide attempt by overdose. There are no delusions or paranoia. There are no auditory or visual hallucinations. Her cognition is grossly intact. Her insight and judgment are questionable.   SUICIDE RISK ASSESSMENT: This is a patient with a long history of depression, mood instability, and prescription pill as well as illicit substance use who was admitted after a suicide attempt.   ASSESSMENT:  AXIS I:  Mood disorder, not otherwise specified. Cocaine abuse, amphetamine abuse, opiate abuse.   AXIS II: Deferred.   AXIS III: Urinary tract infection, chronic pain.   AXIS IV: Mental and physical illness, treatment noncompliance, substance abuse, primary support.   AXIS V: GAF: 45.   PLAN:  1. The patient is no longer suicidal. She is able to contract for safety. I will discontinue sitter. I will start Ambien for sleep, Prozac for anxiety, and  Risperdal for mood stabilization.  2. Substance abuse: The patient minimizes her problems and is not interested in treatment.  3. I do not believe that she will need to come to psychiatry and may be discharged from medicine. I will make appointment for at the local mental health center. I will follow up.      ____________________________ Ellin Goodie. Jennet Maduro, MD jbp:bjt D: 10/31/2011 17:39:12 ET T: 11/01/2011 10:35:07 ET JOB#: [id number]  cc: Jachelle Fluty B. Jennet Maduro, MD, <Dictator> Shari Prows MD ELECTRONICALLY SIGNED 11/02/2011 11:36
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
## 2014-10-25 NOTE — Discharge Summary (Signed)
PATIENT NAME:  Crystal [name], [name] MR#:  [id number] DATE OF BIRTH:  February 04, 1981  DATE OF ADMISSION:  10/27/2011 DATE OF DISCHARGE:  11/01/2011  ADMITTING DIAGNOSIS: Overdose on gabapentin.  DISCHARGE DIAGNOSES:  1. Overdose, possible intentional versus accidental status post evaluation by Dr. Kristine LineaJolanta Pucilowska of psychiatry who felt that the patient is no longer suicidal and recommends outpatient psychiatry follow-up.  2. Urinary tract infection.  3. Opioid withdrawal during hospitalization.  4. Acute hepatitis likely related to medication ingestion. Hepatitis panel was negative and LFTs are close to normal.  5. Hypokalemia, status post replacement.  6. Neuropathy.  7. Chronic back pain.  8. Bipolar disorder.  9. History of asthma.  PERTINENT LABORATORY AND EVALUATIONS: TSH was 0.25. Salicylates were less than 1.7. Alcohol level was less than 3. Tylenol less than 2. Troponin negative. Glucose 98, BUN 7, creatinine 0.71, sodium 144, potassium 3.3, chloride 107, CO2 27, and calcium 8.7. LFTs showed AST 258 and ALT 172. TUDs were positive for amphetamines, cocaine, and opioids.  Urine culture showed greater than 100,000 Escherichia coli.   HIV was negative. Hepatitis A and C were negative. Hepatitis B core antibody IgM was positive.  CT scan of the head on presentation suggested mucosal thickening in the sinuses.   Chest x-ray showed no acute cardiopulmonary processes.   HOSPITAL COURSE: Please see the history and physical done by the admitting physician. The patient is a 34 year old white female who was brought to the ED after overdose on gabapentin. The patient was very agitated in the ED and had to be intubated. The patient was kept on the ventilator for two days due to her being very agitated. Subsequently, she was able to be successfully extubated. Once she was extubated, the patient started having severe nausea, abdominal cramping, and diarrhea symptoms consistent with opioid withdrawal. The  patient was restarted on opioids. She was seen in consultation by psychiatry, Dr. Kristine LineaJolanta Pucilowska, who recommended the patient followup as an outpatient with psychiatry and did not feel that she needed inpatient psychiatric commitment. The patient was no longer suicidal. At this time, the patient is stable for discharge.  CONSULTANTS:  1. Erin FullingKurian Kasa, MD - Pulmonary. 2. Kristine LineaJolanta Pucilowska, MD - Psychiatry.   DISCHARGE MEDICATIONS:  1. Roxicodone 30 mg p.o. every 8 hours p.r.n. pain dispense only 30. No further refills will be given. 2. Prozac 20 mg daily.  3. Tegretol 200 mg p.o. twice a day. 4. Phenergan 12.5 mg p.o. every 8 hours p.r.n. nausea, dispense 30. No further refills will be given. 5. Cipro 500 mg p.o. every 12 hours x3 days.   NOTE: The patient is told not to take Neurontin.   DIET: Regular.   ACTIVITY: As tolerated.              TIMEFRAME FOR FOLLOW UP: Arrangements have been made for the patient to follow-[facility]. The patient is also referred to the [facility] here for back pain for the next available appointment.  TIME SPENT ON DISCHARGE: 35 minutes. ___________________________ Lacie ScottsShreyang H. Allena KatzPatel, MD shp:slb D: 11/01/2011 13:48:14 ET T: 11/02/2011 12:16:38 ET JOB#: [id number]  cc: Meghen Akopyan H. Allena KatzPatel, MD, <Dictator> Charise CarwinSHREYANG H Janda Cargo MD ELECTRONICALLY SIGNED 11/03/2011 15:25

## 2014-10-26 LAB — SURGICAL PATHOLOGY

## 2014-11-01 NOTE — Op Note (Signed)
PATIENT NAME:  Crystal [name], [name] MR#:  [id number] DATE OF BIRTH:  1981-06-08  DATE OF PROCEDURE:  07/05/2014  PREOPERATIVE DIAGNOSIS: Term intrauterine pregnancy, desire for permanent sterility, prior history of cesarean section.   POSTOPERATIVE DIAGNOSES: Term intrauterine pregnancy, desire for permanent sterility, prior history of cesarean section.   PROCEDURE PERFORMED: Low transverse cesarean section, bilateral tubal ligation, placement of On-Q pain pump.   SURGEON: Annamarie MajorPaul Tyja Gortney, MD   ASSISTANT/MIDWIFE: Gasper Lloydolleen L. Sharen HonesGutierrez, CNM   ANESTHESIA: Spinal.   ESTIMATED BLOOD LOSS: 100 mL.   COMPLICATIONS: None.   FINDINGS: Normal tubes, ovaries and uterus. Viable female infant with Apgar scores of 7 and 9 at one and five minutes respectively with thin meconium noted.   DISPOSITION: To the recovery room in stable condition.   TECHNIQUE: The patient was prepped and draped in the usual sterile fashion after adequate anesthesia was obtained in the supine position on the operating room table. A scalpel was used to create a low transverse skin incision that was extended down to the level of the rectus fascia which was dissected bilaterally using Mayo scissors. The rectus muscles were separated in the midline. The peritoneum was penetrated, and the bladder was inferiorly dissected and retracted. A scalpel was used to create a low transverse hysterotomy incision that was extended by blunt dissection. Amniotomy reveals meconium fluid that is thin and nonparticulate. The head was then grasped. No nuchal cord was encountered. The entire infant was delivered without complication, and the infant was  handed to the pediatric team to assess the airway and perform resuscitative measures.   Cord blood was obtained. The placenta was manually extracted. The uterus was externalized and cleansed of all membranes and debris using a moist sponge. A hysterotomy incision was closed with a running #1 Vicryl suture in a  locking fashion followed by additional sutures to obtain excellent hemostasis. Interceed was placed over the incision.   The right and left fallopian tubes were grasped in the midportion with a Babcock clamp, and a loop was tied with 2 Vicryl sutures, excised and cauterized. The uterus was then placed back in the intra-abdominal cavity, and the paracolic gutters were irrigated with warm saline. Re-examination of the tubal and hysterotomy incisions reveals excellent hemostasis.   The peritoneum was closed with a Vicryl suture. Trocars were placed through the abdomen into the subfascial space, and the Silver Soaker catheters associated with the On-Q pain pump were then placed in the subfascial space. The fascia was then closed with 0 Maxon suture. The subcutaneous tissue was irrigated, and hemostasis was assured using electrocautery. Skin was closed with 4-0 Vicryl suture in a subcuticular fashion followed by placement of skin glue. The catheters were flushed with 5 mL each of bupivacaine and then stabilized in place with a Tegaderm bandage. The patient went to the recovery room in stable condition. All sponge, instrument and needle counts were correct.     ____________________________ R. Annamarie MajorPaul Hellena Pridgen, MD rph:JT D: 07/05/2014 09:49:32 ET T: 07/05/2014 16:07:29 ET JOB#: [id number]  cc: Dierdre Searles. Paul Buryl Bamber, MD, <Dictator> Nadara MustardOBERT P Ryden Wainer MD ELECTRONICALLY SIGNED 07/09/2014 8:15

## 2014-11-10 NOTE — H&P (Signed)
L&D Evaluation:  History:  HPI 34yo [id number] at 40.5 by LMP and c/w 15w US with EDC of 06/30/14 and scant prenatal care presents with painful contractions.  no LOF, VB, +FM  Pregnancy complicated by: 1. opioid dependence, on subtex 8mg  TID (switched from suboxone during pregnancy) managed by B&D Behavior 2. cocaine use - last stated use was NYE, 2 days ago. 3. history of prior C/S due to abruption at 38w 4. desire for BTL, consents signed twice 5. history of macrosomic baby 9.5#  6. bipolar with depression 7. herniated disk  O+ / RI / VI / GBS neg   Presents with contractions   Patient's Medical History see above   Patient's Surgical History LEEP  Previous C-Section   Medications subutex 8mg  TID   Allergies NKDA   Social History drugs  see HPI   Family History breast cancer, stroke   ROS:  ROS All systems were reviewed.  HEENT, CNS, GI, GU, Respiratory, CV, Renal and Musculoskeletal systems were found to be normal.   Exam:  Vital Signs stable   Urine Protein UDS pending   General uncomfortable   Mental Status clear   Chest clear   Heart normal sinus rhythm   Abdomen gravid, non-tender, non-tender with and between contractions   Fetal Position cephalic by leopolds   Edema no edema   Pelvic 3cm/70%/-2   Mebranes Intact   FHT normal rate with no decels, no accels, min-to-mod variability   Ucx regular, q2-4 min   Skin small lesions all over dermal surfaces   Impression:  Impression active labor   Plan:  Plan EFM/NST, fluids   Comments 34yo [id number] @ 40.5 in labor  1. discussion with patient regarding options of VBAC vs C/S.  As the patient is in labor, and recovery from c/s would be potentially difficult to manage with her opioid dependence, my recommendation would be to continue with a vaginal delivery.  there is no indication of fetal distress, and although we do not have results of the UDS back, there is no outward indication that the mother or  fetus has any cardiac effects from cocaine use.  Patient requests an epidural and anesthesia will place one after platelets have returned. -continuous toco/efm -IVF -clear diet   2. opioid use - maintenance on subutex 8mg  TID. will continue this regimen throughout patient's stay on L&D and post partum.  Patient may choose to go back on Suboxone after the peripartum, and will discuss with the managing facility after discharge.  3. cocaine use -  -UDS stat -notify NICU -notify SW  CNM Gutierrez and Dr. Tiburcio PeaHarris will take over care of this patient soon after admission (8am), and may alter the plan with any new information.   Electronic Signatures for Addendum Section:  Axil Copeman, Elenora Fenderhelsea C (MD) (Signed Addendum 03-Jan-16 09:04)  Patient spontaneously ruptured for thick meconium approximately 8am.   Electronic Signatures: Tyronica Truxillo, Elenora Fenderhelsea C (MD)  (Signed 03-Jan-16 09:03)  Authored: L&D Evaluation   Last Updated: 03-Jan-16 09:04 by Coda Mathey, Elenora Fenderhelsea C (MD)

## 2015-03-17 ENCOUNTER — Ambulatory Visit (INDEPENDENT_AMBULATORY_CARE_PROVIDER_SITE_OTHER): Payer: Medicaid Other | Admitting: Family Medicine

## 2015-03-17 ENCOUNTER — Encounter: Payer: Self-pay | Admitting: Family Medicine

## 2015-03-17 VITALS — BP 112/68 | HR 102 | Temp 98.7°F | Resp 18 | Ht 66.0 in | Wt 135.7 lb

## 2015-03-17 DIAGNOSIS — R21 Rash and other nonspecific skin eruption: Secondary | ICD-10-CM | POA: Diagnosis not present

## 2015-03-17 DIAGNOSIS — F1920 Other psychoactive substance dependence, uncomplicated: Secondary | ICD-10-CM | POA: Diagnosis not present

## 2015-03-17 DIAGNOSIS — J40 Bronchitis, not specified as acute or chronic: Secondary | ICD-10-CM

## 2015-03-17 DIAGNOSIS — J209 Acute bronchitis, unspecified: Secondary | ICD-10-CM

## 2015-03-17 DIAGNOSIS — Z113 Encounter for screening for infections with a predominantly sexual mode of transmission: Secondary | ICD-10-CM

## 2015-03-17 MED ORDER — DOXYCYCLINE HYCLATE 100 MG PO TABS
100.0000 mg | ORAL_TABLET | Freq: Two times a day (BID) | ORAL | Status: DC
Start: 2015-03-17 — End: 2015-03-18

## 2015-03-17 MED ORDER — HYDROCORTISONE VALERATE 0.2 % EX CREA: 1.0000 "application " | TOPICAL_CREAM | Freq: Two times a day (BID) | CUTANEOUS | Status: AC

## 2015-03-17 MED ORDER — PREDNISONE 10 MG (48) PO TBPK: ORAL_TABLET | Freq: Every day | ORAL | Status: AC

## 2015-03-17 NOTE — Progress Notes (Signed)
Name: Crystal [name]   MRN: [id number]    DOB: 1980/09/05   Date:03/17/2015       Progress Note  Subjective  Chief Complaint  Chief Complaint  Patient presents with  . Rash  . URI    HPI   Crystal [name] is a 34 year old female with a known history of polysubstance abuse, opioid addiction, Bipolar mood disorder, ADHD who has recently had a child earlier this year. She is here today (2nd visit with me since establishing care over a year ago) to discuss a rash and URI symptoms. Patient complains of rash involving the generalized and involving bilateral arms, buttocks, legs and less involving the back but not involving palms, soles, chest, abdomen, neck fact, genitalia. Rash started several months ago. Appearance of rash at onset: Color of lesion(s): brown, dark and fawn-colored, Texture of lesion(s): flat. Rash has not changed over time Initial distribution: generalized.  Discomfort associated with rash: is pruritic.  Associated symptoms: congestion, fever, irritability, sore throat and but these symptoms started recently only and she feels they are separate issues. Denies: abdominal pain, decrease in appetite, decrease in energy level, headache and myalgia. Patient has not had previous evaluation of rash. Patient has not had previous treatment.  Patient has not had contacts with similar rash. Patient has not identified precipitant. Patient has not had new exposures soaps, lotions, laundry detergents, foods, medications, plants, insects or animals. URI symptoms consist of nasal congestion, cough with sputum production, fevers, ear pressure. These symptoms started about 1 week ago. She denies IV drug use.    Patient Active Problem List   Diagnosis Date Noted  . Rash of entire body 03/17/2015  . Bronchitis with bronchospasm 03/17/2015  . Screening for STD (sexually transmitted disease) 03/17/2015  . Polysubstance dependence 09/12/2012  . Opioid dependence 09/12/2012  . ADHD (attention deficit  hyperactivity disorder) 09/12/2012  . Bipolar 1 disorder, mixed, partial remission 09/12/2012    Social History  Substance Use Topics  . Smoking status: Current Every Day Smoker -- 1.00 packs/day  . Smokeless tobacco: Never Used  . Alcohol Use: 1.8 oz/week    3 Cans of beer per week     Comment: 3 24 oz beers daily     Current outpatient prescriptions:  .  buprenorphine-naloxone (SUBOXONE) 8-2 MG SUBL SL tablet, Place 1 tablet under the tongue daily., Disp: , Rfl:  .  gabapentin (NEURONTIN) 100 MG capsule, Take 100 mg by mouth 3 (three) times daily., Disp: , Rfl:  .  doxycycline (VIBRA-TABS) 100 MG tablet, Take 1 tablet (100 mg total) by mouth 2 (two) times daily., Disp: 24 tablet, Rfl: 0 .  hydrocortisone valerate cream (WESTCORT) 0.2 %, Apply 1 application topically 2 (two) times daily., Disp: 60 g, Rfl: 1 .  hydrOXYzine (ATARAX/VISTARIL) 50 MG tablet, Take 1 tablet (50 mg total) by mouth every 4 (four) hours as needed for anxiety. For anxiety/sleep, Disp: 120 tablet, Rfl: 0 .  ibuprofen (ADVIL,MOTRIN) 800 MG tablet, Take 1 tablet (800 mg total) by mouth every 8 (eight) hours as needed for pain (with food). For moderate pain, Disp: 30 tablet, Rfl:  .  Multiple Vitamins-Minerals (MULTIVITAMIN WITH MINERALS) tablet, Take 1 tablet by mouth daily. For vitamin deficiency, Disp: , Rfl:  .  predniSONE (STERAPRED UNI-PAK 48 TAB) 10 MG (48) TBPK tablet, Take by mouth daily. Use as directed in a 12 day taper PredPak, Disp: 48 tablet, Rfl: 0 .  vitamin B-12 100 MCG tablet, Take 1 tablet (100  mcg total) by mouth daily. For Cyanocobalamin deficiency, Disp: 30 tablet, Rfl: 0  Past Surgical History  Procedure Laterality Date  . Cesarean section  2009  . Tonsillectomy    . Bilateral carpal tunnel release    . Dilation and curettage of uterus      miscarriage  . Laparoscopy  2011    Family History  Problem Relation Age of Onset  . Cancer Maternal Grandmother     breast  . Hypertension  Maternal Grandmother   . Heart disease Paternal Grandfather   . Diabetes Father   . Diabetes Maternal Grandfather     Allergies  Allergen Reactions  . Buspar [Buspirone]     Unsteady gait  . Lexapro [Escitalopram Oxalate] Diarrhea  . Nubain [Nalbuphine Hcl]     restless  . Tylenol [Acetaminophen] Diarrhea  . Vicodin [Hydrocodone-Acetaminophen]     GI distress  . Zanaflex [Tizanidine Hcl]     Altered mental status     Review of Systems  CONSTITUTIONAL: No significant weight changes, fever, chills, weakness or fatigue.  HEENT:  - Eyes: No visual changes.  - Ears: No auditory changes. No pain.  - Nose: Yes sneezing, congestion, runny nose. - Throat: Yes sore throat. No changes in swallowing. SKIN: Yes rash.  CARDIOVASCULAR: No chest pain, chest pressure or chest discomfort. No palpitations or edema.  RESPIRATORY: No shortness of breath. Yes cough. GASTROINTESTINAL: No anorexia, nausea, vomiting. No changes in bowel habits. No abdominal pain or blood.  GENITOURINARY: No dysuria. No frequency. No discharge. NEUROLOGICAL: No headache, dizziness, syncope, paralysis, ataxia, numbness or tingling in the extremities. No memory changes. No change in bowel or bladder control.  MUSCULOSKELETAL: No joint pain. No muscle pain. HEMATOLOGIC: No anemia, bleeding or bruising.  LYMPHATICS: No enlarged lymph nodes.  PSYCHIATRIC: No change in mood. No change in sleep pattern.  ENDOCRINOLOGIC: No reports of sweating, cold or heat intolerance. No polyuria or polydipsia.     Objective  BP 112/68 mmHg  Pulse 102  Temp(Src) 98.7 F (37.1 C) (Oral)  Resp 18  Ht 5\' 6"  (1.676 m)  Wt 135 lb 11.2 oz (61.553 kg)  BMI 21.91 kg/m2  SpO2 97%  LMP 03/10/2015 (Exact Date) Body mass index is 21.91 kg/(m^2).  Physical Exam  Constitutional: Patient appears well-developed and well-nourished. In no distress.  HEENT:  - Head: Normocephalic and atraumatic.  - Ears: Bilateral TMs bulging with clear  fluid, no erythema or effusion - Nose: Nasal mucosa moist, boggy, congested. - Mouth/Throat: Oropharynx is clear and moist. No tonsillar hypertrophy or erythema. Yes post nasal drainage.  - Eyes: Conjunctivae clear, EOM movements normal. PERRLA. No scleral icterus.  Neck: Normal range of motion. Neck supple. No JVD present. No thyromegaly present.  Cardiovascular: Normal rate, regular rhythm and normal heart sounds.  No murmur heard.  Pulmonary/Chest: Effort normal and breath sounds decreased with scattered wheezing and rhonchi. No respiratory distress. Musculoskeletal: Normal range of motion bilateral UE and LE, no joint effusions. Peripheral vascular: Bilateral LE no edema. Neurological: CN II-XII grossly intact with no focal deficits. Alert and oriented to person, place, and time. Coordination, balance, strength, speech and gait are normal.  Skin: Skin is warm and dry. Scattered discrete circular lesions at various stages, no blisters, no coalescing of lesions, no boils. Some lesions are red centered with hyperpigmented borders. Located on upper arms, legs, buttocks but not so much on abdomen, chest, back, face, scalp, palms or soles or oral.  Psychiatric: Patient has an agitated mood  and affect. Pressured speech. Behavior is stable in office today but she is easily distracted. Judgment and thought content normal in office today.    Assessment & Plan  1. Rash of entire body Etiology unclear will start with blood work.   - doxycycline (VIBRA-TABS) 100 MG tablet; Take 1 tablet (100 mg total) by mouth 2 (two) times daily.  Dispense: 24 tablet; Refill: 0 - predniSONE (STERAPRED UNI-PAK 48 TAB) 10 MG (48) TBPK tablet; Take by mouth daily. Use as directed in a 12 day taper PredPak  Dispense: 48 tablet; Refill: 0 - CBC with Differential/Platelet - Comprehensive metabolic panel - ANA - Hepatitis C antibody - HIV antibody - RPR - HSV 2 antibody, IgG - Fluorescent treponemal ab(fta)-IgG-bld -  Hepatitis A antibody, total - Hepatitis B surface antibody - hydrocortisone valerate cream (WESTCORT) 0.2 %; Apply 1 application topically 2 (two) times daily.  Dispense: 60 g; Refill: 1  2. Bronchitis with bronchospasm Increase hydration. Start antibiotics and prednisone taper.  The patient has been counseled on the proper use, side effects and potential interactions of the new medication. Patient encouraged to review the side effects and safety profile pamphlet provided with the prescription from the pharmacy as well as request counseling from the pharmacy team as needed.    - doxycycline (VIBRA-TABS) 100 MG tablet; Take 1 tablet (100 mg total) by mouth 2 (two) times daily.  Dispense: 24 tablet; Refill: 0 - predniSONE (STERAPRED UNI-PAK 48 TAB) 10 MG (48) TBPK tablet; Take by mouth daily. Use as directed in a 12 day taper PredPak  Dispense: 48 tablet; Refill: 0 - CBC with Differential/Platelet - Comprehensive metabolic panel  3. Screening for STD (sexually transmitted disease)  - Hepatitis C antibody - HIV antibody - RPR - HSV 2 antibody, IgG - Fluorescent treponemal ab(fta)-IgG-bld - Hepatitis A antibody, total - Hepatitis B surface antibody  4. Polysubstance dependence Per patient she has not engaged in illicit drug use recently. I see no track marks today.

## 2015-03-18 ENCOUNTER — Other Ambulatory Visit: Payer: Self-pay | Admitting: Family Medicine

## 2015-03-18 ENCOUNTER — Telehealth: Payer: Self-pay | Admitting: Family Medicine

## 2015-03-18 DIAGNOSIS — R112 Nausea with vomiting, unspecified: Secondary | ICD-10-CM | POA: Insufficient documentation

## 2015-03-18 DIAGNOSIS — R768 Other specified abnormal immunological findings in serum: Secondary | ICD-10-CM | POA: Insufficient documentation

## 2015-03-18 DIAGNOSIS — K7460 Unspecified cirrhosis of liver: Secondary | ICD-10-CM

## 2015-03-18 DIAGNOSIS — B182 Chronic viral hepatitis C: Secondary | ICD-10-CM | POA: Insufficient documentation

## 2015-03-18 DIAGNOSIS — R7689 Other specified abnormal immunological findings in serum: Secondary | ICD-10-CM | POA: Insufficient documentation

## 2015-03-18 DIAGNOSIS — R21 Rash and other nonspecific skin eruption: Secondary | ICD-10-CM

## 2015-03-18 LAB — CBC WITH DIFFERENTIAL/PLATELET
Basophils Absolute: 0 10*3/uL (ref 0.0–0.2)
Basos: 0 %
EOS (ABSOLUTE): 0.1 10*3/uL (ref 0.0–0.4)
Eos: 1 %
Hematocrit: 38.3 % (ref 34.0–46.6)
Hemoglobin: 12.7 g/dL (ref 11.1–15.9)
IMMATURE GRANS (ABS): 0 10*3/uL (ref 0.0–0.1)
Immature Granulocytes: 0 %
LYMPHS ABS: 1.4 10*3/uL (ref 0.7–3.1)
LYMPHS: 16 %
MCH: 27 pg (ref 26.6–33.0)
MCHC: 33.2 g/dL (ref 31.5–35.7)
MCV: 81 fL (ref 79–97)
MONOCYTES: 7 %
Monocytes Absolute: 0.6 10*3/uL (ref 0.1–0.9)
Neutrophils Absolute: 6.4 10*3/uL (ref 1.4–7.0)
Neutrophils: 76 %
PLATELETS: 258 10*3/uL (ref 150–379)
RBC: 4.71 x10E6/uL (ref 3.77–5.28)
RDW: 15.1 % (ref 12.3–15.4)
WBC: 8.5 10*3/uL (ref 3.4–10.8)

## 2015-03-18 LAB — HIV ANTIBODY (ROUTINE TESTING W REFLEX): HIV SCREEN 4TH GENERATION: NONREACTIVE

## 2015-03-18 LAB — COMPREHENSIVE METABOLIC PANEL
ALK PHOS: 85 IU/L (ref 39–117)
ALT: 61 IU/L — AB (ref 0–32)
AST: 47 IU/L — ABNORMAL HIGH (ref 0–40)
Albumin/Globulin Ratio: 1.5 (ref 1.1–2.5)
Albumin: 4.3 g/dL (ref 3.5–5.5)
BUN / CREAT RATIO: 19 (ref 8–20)
BUN: 13 mg/dL (ref 6–20)
Bilirubin Total: 0.3 mg/dL (ref 0.0–1.2)
CHLORIDE: 98 mmol/L (ref 97–108)
CO2: 25 mmol/L (ref 18–29)
CREATININE: 0.69 mg/dL (ref 0.57–1.00)
Calcium: 9.2 mg/dL (ref 8.7–10.2)
GFR calc Af Amer: 130 mL/min/{1.73_m2} (ref >59)
GFR calc non Af Amer: 113 mL/min/{1.73_m2} (ref >59)
GLUCOSE: 100 mg/dL — AB (ref 65–99)
Globulin, Total: 2.8 g/dL (ref 1.5–4.5)
Potassium: 4.1 mmol/L (ref 3.5–5.2)
Sodium: 139 mmol/L (ref 134–144)
Total Protein: 7.1 g/dL (ref 6.0–8.5)

## 2015-03-18 LAB — HEPATITIS B SURFACE ANTIBODY,QUALITATIVE: HEP B SURFACE AB, QUAL: NONREACTIVE

## 2015-03-18 LAB — RPR: RPR Ser Ql: NONREACTIVE

## 2015-03-18 LAB — HEPATITIS C ANTIBODY

## 2015-03-18 LAB — HEPATITIS A ANTIBODY, TOTAL: Hep A Total Ab: NEGATIVE

## 2015-03-18 LAB — HSV 2 ANTIBODY, IGG: HSV 2 Glycoprotein G Ab, IgG: 12.8 index — ABNORMAL HIGH (ref 0.00–0.90)

## 2015-03-18 LAB — ANA: ANA: NEGATIVE

## 2015-03-18 MED ORDER — PROMETHAZINE HCL 12.5 MG PO TABS: 12.5000 mg | ORAL_TABLET | Freq: Four times a day (QID) | ORAL | Status: AC | PRN

## 2015-03-18 MED ORDER — SULFAMETHOXAZOLE-TRIMETHOPRIM 800-160 MG PO TABS: 1.0000 | ORAL_TABLET | Freq: Two times a day (BID) | ORAL | Status: AC

## 2015-03-18 NOTE — Telephone Encounter (Signed)
I spoke with Crystal [name] myself over the phone. After verifying name and DOB I discussed Crystal [name] recent lab results with Crystal [name]. She is aware of Hep C diagnosis but has never had treatment. She is willing to consult with a hepatology GI specialist for which I have placed a referral as there are newer treatment options. I have expressed to Crystal [name] that there are serious consequences from untreated Hep C. Crystal [name] rash may be related to Hep C. She had nausea and vomiting to Doxycycline so I have switched Crystal [name] to Bactrim DS plus promethazine. She is tolerating the prednisone well. If Crystal [name] rash is not related to Hep C and not alleviated the plan is to consult Dermatology.  Otherwise she requested a letter stating that Crystal [name] current skin lesions are not related to substance abuse. She denies illicit drug use especially IV drug use as she has been accused of having track marks and social services is investigating Crystal [name] and Crystal [name] ability to be a fit mother to Crystal [name] young new child born earlier this year. I did not see any track marks on Crystal [name] exam at our visit yesterday however [name] does have a remote history of cocaine use (see UDS done at other facility) and a past history of polysubstance abuse.

## 2015-03-19 LAB — FLUORESCENT TREPONEMAL AB(FTA)-IGG-BLD: Fluorescent Treponemal Ab, IgG: NONREACTIVE

## 2015-03-22 ENCOUNTER — Telehealth: Payer: Self-pay | Admitting: Family Medicine

## 2015-03-22 NOTE — Telephone Encounter (Signed)
Letter is ready for pick-up.

## 2015-03-22 NOTE — Telephone Encounter (Signed)
Patient states that you had written her a letter and it has been misplaced. Would like to know if you could please reprint it and she will pick it up. Thank you

## 2015-03-22 NOTE — Telephone Encounter (Signed)
Patient informed and will pick the letter up either today or tomorrow

## 2015-03-24 ENCOUNTER — Telehealth: Payer: Self-pay | Admitting: Family Medicine

## 2015-03-24 NOTE — Telephone Encounter (Signed)
Cindy from [facility] Dept states patient referral appointment has been made for 04-08-15 @ 3:15p with Dr. Dow Adolph. Being seen for Hep C and Serous of the liver. She was authorized to be seen 1 time, however this will be a long term care and they are needing more authorizations.  [phone]

## 2015-03-29 NOTE — Telephone Encounter (Signed)
Called Cindy at [facility] authorized 6 visits til the end of the year due chronic Hep C treatment.

## 2015-06-10 ENCOUNTER — Ambulatory Visit: Payer: Medicaid Other | Admitting: Family Medicine

## 2015-06-14 ENCOUNTER — Ambulatory Visit: Payer: Medicaid Other | Admitting: Family Medicine

## 2015-07-27 ENCOUNTER — Ambulatory Visit: Payer: Medicaid Other | Admitting: Family Medicine

## 2015-07-28 ENCOUNTER — Ambulatory Visit: Payer: Medicaid Other | Admitting: Family Medicine

## 2016-11-11 ENCOUNTER — Encounter: Payer: Self-pay | Admitting: Emergency Medicine

## 2016-11-11 ENCOUNTER — Emergency Department
Admission: EM | Admit: 2016-11-11 | Discharge: 2016-11-12 | Disposition: A | Discharge status: Home / Self Care | Payer: Medicaid Other | Attending: Emergency Medicine | Admitting: Emergency Medicine

## 2016-11-11 DIAGNOSIS — F1910 Other psychoactive substance abuse, uncomplicated: Secondary | ICD-10-CM

## 2016-11-11 DIAGNOSIS — R410 Disorientation, unspecified: Secondary | ICD-10-CM | POA: Diagnosis not present

## 2016-11-11 DIAGNOSIS — F17210 Nicotine dependence, cigarettes, uncomplicated: Secondary | ICD-10-CM | POA: Insufficient documentation

## 2016-11-11 DIAGNOSIS — Z79899 Other long term (current) drug therapy: Secondary | ICD-10-CM | POA: Diagnosis not present

## 2016-11-11 DIAGNOSIS — F1410 Cocaine abuse, uncomplicated: Secondary | ICD-10-CM | POA: Diagnosis present

## 2016-11-11 DIAGNOSIS — F1110 Opioid abuse, uncomplicated: Secondary | ICD-10-CM | POA: Diagnosis not present

## 2016-11-11 DIAGNOSIS — J45909 Unspecified asthma, uncomplicated: Secondary | ICD-10-CM | POA: Diagnosis not present

## 2016-11-11 HISTORY — DX: Bipolar disorder, unspecified: F31.9

## 2016-11-11 LAB — CBC
HCT: 38.2 % (ref 35.0–47.0)
HEMOGLOBIN: 13 g/dL (ref 12.0–16.0)
MCH: 27.2 pg (ref 26.0–34.0)
MCHC: 34.1 g/dL (ref 32.0–36.0)
MCV: 79.9 fL — AB (ref 80.0–100.0)
PLATELETS: 291 10*3/uL (ref 150–440)
RBC: 4.79 MIL/uL (ref 3.80–5.20)
RDW: 15.6 % — ABNORMAL HIGH (ref 11.5–14.5)
WBC: 8.4 10*3/uL (ref 3.6–11.0)

## 2016-11-11 LAB — URINE DRUG SCREEN, QUALITATIVE (ARMC ONLY)
Amphetamines, Ur Screen: NOT DETECTED
BARBITURATES, UR SCREEN: NOT DETECTED
Benzodiazepine, Ur Scrn: NOT DETECTED
COCAINE METABOLITE, UR ~~LOC~~: POSITIVE — AB
Cannabinoid 50 Ng, Ur ~~LOC~~: POSITIVE — AB
MDMA (ECSTASY) UR SCREEN: NOT DETECTED
Methadone Scn, Ur: NOT DETECTED
Opiate, Ur Screen: NOT DETECTED
PHENCYCLIDINE (PCP) UR S: NOT DETECTED
TRICYCLIC, UR SCREEN: NOT DETECTED

## 2016-11-11 LAB — COMPREHENSIVE METABOLIC PANEL
ALK PHOS: 50 U/L (ref 38–126)
ALT: 45 U/L (ref 14–54)
AST: 78 U/L — AB (ref 15–41)
Albumin: 4.3 g/dL (ref 3.5–5.0)
Anion gap: 11 (ref 5–15)
BUN: 20 mg/dL (ref 6–20)
CALCIUM: 9.3 mg/dL (ref 8.9–10.3)
CHLORIDE: 103 mmol/L (ref 101–111)
CO2: 24 mmol/L (ref 22–32)
CREATININE: 0.8 mg/dL (ref 0.44–1.00)
Glucose, Bld: 98 mg/dL (ref 65–99)
Potassium: 3.5 mmol/L (ref 3.5–5.1)
SODIUM: 138 mmol/L (ref 135–145)
Total Bilirubin: 0.9 mg/dL (ref 0.3–1.2)
Total Protein: 7.5 g/dL (ref 6.5–8.1)

## 2016-11-11 LAB — SALICYLATE LEVEL

## 2016-11-11 LAB — ACETAMINOPHEN LEVEL: Acetaminophen (Tylenol), Serum: <10 ug/mL — ABNORMAL LOW (ref 10–30)

## 2016-11-11 LAB — ETHANOL

## 2016-11-11 MED ORDER — LORAZEPAM 2 MG/ML IJ SOLN
INTRAMUSCULAR | Status: AC
  Administered 2016-11-11: 1 mg via INTRAVENOUS
  Filled 2016-11-11: qty 1

## 2016-11-11 MED ORDER — LORAZEPAM 2 MG/ML IJ SOLN
1.0000 mg | Freq: Once | INTRAMUSCULAR | Status: AC
  Administered 2016-11-11: 1 mg via INTRAMUSCULAR

## 2016-11-11 MED ORDER — DIPHENHYDRAMINE HCL 50 MG/ML IJ SOLN
50.0000 mg | Freq: Once | INTRAMUSCULAR | Status: AC
  Administered 2016-11-11: 50 mg via INTRAMUSCULAR

## 2016-11-11 MED ORDER — DIPHENHYDRAMINE HCL 50 MG/ML IJ SOLN
INTRAMUSCULAR | Status: AC
  Administered 2016-11-11: 50 mg via INTRAMUSCULAR
  Filled 2016-11-11: qty 1

## 2016-11-11 MED ORDER — HALOPERIDOL LACTATE 5 MG/ML IJ SOLN
5.0000 mg | Freq: Once | INTRAMUSCULAR | Status: AC
  Administered 2016-11-11: 5 mg via INTRAMUSCULAR

## 2016-11-11 MED ORDER — HALOPERIDOL LACTATE 5 MG/ML IJ SOLN
INTRAMUSCULAR | Status: AC
  Administered 2016-11-11: 5 mg via INTRAMUSCULAR
  Filled 2016-11-11: qty 1

## 2016-11-11 MED ORDER — LORAZEPAM 2 MG/ML IJ SOLN
1.0000 mg | Freq: Once | INTRAMUSCULAR | Status: AC
  Administered 2016-11-11: 1 mg via INTRAVENOUS

## 2016-11-11 NOTE — ED Notes (Signed)
Pt ivc'd by sheriff's for erratic behavior and walking in and out of traffic on [address]. Pt extremely restless and admits to crack last night, taking her methadone and oral ritalin and snorting ritalin.
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
## 2016-11-11 NOTE — ED Provider Notes (Signed)
[facility] Emergency Department Provider Note    First MD Initiated Contact with Patient 11/11/16 1756     (approximate)  I have reviewed the triage vital signs and the nursing notes.  Level 5 caveat: Altered mental status  HISTORY  Chief Complaint Psychiatric Evaluation   HPI XOEY WARMOTH is a 36 y.o. female with a bolus of chronic medical conditions including polysubstance abuse presents to the emergency department in police custody. Per police officer to bedside patient was found running in and out of traffic with very bizarre behavior. Patient admits to using cocaine today she also admits to snorting "Ritalin and eating a couple pills of Ritalin" today as well. Patient presents in very bizarre affect restless and agitated.  Past Medical History:  Diagnosis Date  . Anemia   . Arthritis   . Asthma   . Bipolar 1 disorder (HCC)   . Depression   . GERD (gastroesophageal reflux disease)   . Headache(784.0)   . Mental disorder   . Neuromuscular disorder [facility])     Patient Active Problem List   Diagnosis Date Noted  . 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 Chronic hepatitis C with cirrhosis (HCC) 03/18/2015  . Nausea & vomiting 03/18/2015  . Rash of entire body 03/17/2015  . Bronchitis with bronchospasm 03/17/2015  . Screening for STD (sexually transmitted disease) 03/17/2015  . Polysubstance dependence (HCC) 09/12/2012  . Opioid dependence (HCC) 09/12/2012  . ADHD (attention deficit hyperactivity disorder) 09/12/2012  . Bipolar 1 disorder, mixed, partial remission (HCC) 09/12/2012    Past Surgical History:  Procedure Laterality Date  . BILATERAL CARPAL TUNNEL RELEASE    . CESAREAN SECTION  2009  . DILATION AND CURETTAGE OF UTERUS     miscarriage  . LAPAROSCOPY  2011  . TONSILLECTOMY      Prior to Admission medications   Medication Sig Start Date End Date Taking? Authorizing Provider  buprenorphine-naloxone (SUBOXONE) 8-2 MG SUBL SL tablet  Place 1 tablet under the tongue daily.    [provider]  gabapentin (NEURONTIN) 100 MG capsule Take 100 mg by mouth 3 (three) times daily.    [provider]  hydrocortisone valerate cream (WESTCORT) 0.2 % Apply 1 application topically 2 (two) times daily. 03/17/15   Edwena Felty, MD  hydrOXYzine (ATARAX/VISTARIL) 50 MG tablet Take 1 tablet (50 mg total) by mouth every 4 (four) hours as needed for anxiety. For anxiety/sleep 09/18/12   Armandina Stammer I, NP  ibuprofen (ADVIL,MOTRIN) 800 MG tablet Take 1 tablet (800 mg total) by mouth every 8 (eight) hours as needed for pain (with food). For moderate pain 09/18/12   Armandina Stammer I, NP  Multiple Vitamins-Minerals (MULTIVITAMIN WITH MINERALS) tablet Take 1 tablet by mouth daily. For vitamin deficiency 09/18/12   Armandina Stammer I, NP  predniSONE (STERAPRED UNI-PAK 48 TAB) 10 MG (48) TBPK tablet Take by mouth daily. Use as directed in a 12 day taper PredPak 03/17/15   Edwena Felty, MD  promethazine (PHENERGAN) 12.5 MG tablet Take 1 tablet (12.5 mg total) by mouth every 6 (six) hours as needed for nausea or vomiting. 03/18/15   Edwena Felty, MD  sulfamethoxazole-trimethoprim (BACTRIM DS,SEPTRA DS) 800-160 MG per tablet Take 1 tablet by mouth 2 (two) times daily. 03/18/15   Edwena Felty, MD  vitamin B-12 100 MCG tablet Take 1 tablet (100 mcg total) by mouth daily. For Cyanocobalamin deficiency 09/18/12   Armandina Stammer I, NP    Allergies Buspar [buspirone]; Lexapro [escitalopram oxalate]; Nubain [  nalbuphine hcl]; Tylenol [acetaminophen]; Vicodin [hydrocodone-acetaminophen]; and Zanaflex [tizanidine hcl]  Family History  Problem Relation Age of Onset  . Cancer Maternal Grandmother        breast  . Hypertension Maternal Grandmother   . Heart disease Paternal Grandfather   . Diabetes Father   . Diabetes Maternal Grandfather     Social History Social History  Substance Use Topics  . Smoking status: Current Every Day Smoker     Packs/day: 1.00  . Smokeless tobacco: Never Used  . Alcohol use 1.8 oz/week    3 Cans of beer per week     Comment: 3 24 oz beers daily    Review of Systems Constitutional: No fever/chills Eyes: No visual changes. ENT: No sore throat. Cardiovascular: Denies chest pain. Respiratory: Denies shortness of breath. Gastrointestinal: No abdominal pain.  No nausea, no vomiting.  No diarrhea.  No constipation. Genitourinary: Negative for dysuria. Musculoskeletal: Negative for back pain. Integumentary: Negative for rash. Neurological: Negative for headaches, focal weakness or numbness. Psychiatric:   ____________________________________________   PHYSICAL EXAM:  VITAL SIGNS: ED Triage Vitals  Enc Vitals Group     BP 11/11/16 1759 101/70     Pulse Rate 11/11/16 1759 84     Resp 11/11/16 1759 20     Temp 11/11/16 1759 98.4 F (36.9 C)     Temp Source 11/11/16 1759 Oral     SpO2 11/11/16 1759 95 %     Weight --      Height --      Head Circumference --      Peak Flow --      Pain Score 11/11/16 1755 8     Pain Loc --      Pain Edu? --      Excl. in GC? --     Constitutional: Agitated persistent motion attempting to get out of bed  Eyes: Conjunctivae are normal. PERRL. EOMI. Head: Atraumatic. Mouth/Throat: Mucous membranes are moist.  Oropharynx non-erythematous. Neck: No stridor.  Cardiovascular: Tachycardia regular rhythm. Good peripheral circulation. Grossly normal heart sounds. Respiratory: Normal respiratory effort.  No retractions. Lungs CTAB. Gastrointestinal: Soft and nontender. No distention.  Musculoskeletal: No lower extremity tenderness nor edema. No gross deformities of extremities. Neurologic:  Normal speech and language. No gross focal neurologic deficits are appreciated.  Skin:  Skin is warm, dry and intact. No rash noted. Psychiatric: Bizarre behavior restlessness agitated ____________________________________________   LABS (all labs ordered are listed,  but only abnormal results are displayed)  Labs Reviewed  COMPREHENSIVE METABOLIC PANEL - Abnormal; Notable for the following:       Result Value   AST 78 (*)    All other components within normal limits  ACETAMINOPHEN LEVEL - Abnormal; Notable for the following:    Acetaminophen (Tylenol), Serum <10 (*)    All other components within normal limits  CBC - Abnormal; Notable for the following:    MCV 79.9 (*)    RDW 15.6 (*)    All other components within normal limits  URINE DRUG SCREEN, QUALITATIVE ([facility] ONLY) - Abnormal; Notable for the following:    Cocaine Metabolite,Ur [place] POSITIVE (*)    Cannabinoid 50 Ng, Ur [place] POSITIVE (*)    All other components within normal limits  ETHANOL  SALICYLATE LEVEL   ____________________________________________   Procedures   ____________________________________________   INITIAL IMPRESSION / ASSESSMENT AND PLAN / ED COURSE  Pertinent labs & imaging results that were available during my care of the patient were  reviewed by me and considered in my medical decision making (see chart for details).  Patient markedly agitated restless posing potential risk to herself and staff as such patient given Haldol 5 mg Benadryl 50 and Ativan for chemical sedation with resolution of agitation      ____________________________________________  FINAL CLINICAL IMPRESSION(S) / ED DIAGNOSES  Final diagnoses:  Polysubstance abuse     MEDICATIONS GIVEN DURING THIS VISIT:  Medications  LORazepam (ATIVAN) injection 1 mg (1 mg Intravenous Given 11/11/16 1803)  diphenhydrAMINE (BENADRYL) injection 50 mg (50 mg Intramuscular Given 11/11/16 1831)  haloperidol lactate (HALDOL) injection 5 mg (5 mg Intramuscular Given 11/11/16 1832)  LORazepam (ATIVAN) injection 1 mg (1 mg Intramuscular Given 11/11/16 1831)     NEW OUTPATIENT MEDICATIONS STARTED DURING THIS VISIT:  New Prescriptions   No medications on file    Modified Medications   No medications on  file    Discontinued Medications   No medications on file     Note:  This document was prepared using Dragon voice recognition software and may include unintentional dictation errors.    Darci CurrentBrown, [place], MD 11/11/16 [phone]

## 2016-11-11 NOTE — Discharge Instructions (Addendum)
Return to the ER for any worsening condition including altered mental status, confusion, seizure, or any other symptoms concerning to.   Psychiatrist recommends discontinuing Ritalin and Xanax and do not take illegal drugs.

## 2016-11-11 NOTE — ED Notes (Signed)
Pt rolling on floor while attached to bp cuff and pulse ox - unhooked d/t choking hazard.

## 2016-11-11 NOTE — ED Notes (Signed)
Pt almost threw herself off the bed - not purposely but pt unable to stop moving. Pt placed on the floor and medicated as ordered with sitter at bedside.

## 2016-11-11 NOTE — ED Notes (Signed)
Pt easily arousable - covered with blanket

## 2016-11-12 ENCOUNTER — Emergency Department: Payer: Medicaid Other

## 2016-11-12 NOTE — ED Notes (Signed)
Pt is attempting to reach a family member to pick her up.  She was advised that she is up for discharge once she has a ride she can safely go with.  Pt has not been successful at reaching anyone to pick her up so far, even with the help of staff

## 2016-11-12 NOTE — BHH Counselor (Signed)
Contacted [facility] to conduct assessment.  Advised that Pt is to be discharged.  Consult to be closed.

## 2016-11-12 NOTE — BH Assessment (Signed)
Attempted to facilitate the consult. I spoke with Corrie DandyMary, Nurse and explained the pt is not coherent and cannot engage in the consult. I asked Corrie DandyMary pull the consult and reorder when the pt can engage in the consult. Pt was sleeping, not able to respond to questions or stay awake. I spoke with Governor Rooksebecca Lord, MD, who ordered the consult and asked is she would consider pulling the consult and reordering when the pt is able to engage.

## 2016-11-12 NOTE — ED Notes (Signed)
Pt taken to CT with BPD and RN. Pt calm and cooperative. Now resting in room. NAD noted.

## 2016-11-12 NOTE — ED Provider Notes (Signed)
I was able take the patient up around 11 AM, off her food. She states she doesn't know what happened last night. She is not really being forthcoming about substance abuse. She states that her moods have been "okay" but she does report a history of bipolar and depression. She states that she is on medication although when I discussed the fact that there is an episode where she was uncontrollable last night concerning for psychosis versus substance abuse, she states that she does not know.  She was placed under involuntary commitment by the ED provider 2 shifts ago. I am going to go ahead and have her consult with tele-psychiatry given her history and the psychosis episode although seems most likely substance abuse induced.  11:40 AM I spoke with the specialist on-call who was unable to do an adequate evaluation given the patient was sitting up against the wall sleeping and not cooperating.  Although she was alert than this for me just earlier, I think given ongoing altered mental status I think it's valuable to go ahead and evaluate with head CT to make sure that were not missing something here.  CT head:    CLINICAL DATA: Confusion  EXAM: CT HEAD WITHOUT CONTRAST  TECHNIQUE: Contiguous axial images were obtained from the base of the skull through the vertex without intravenous contrast.  COMPARISON: CT head 05/13/2012  FINDINGS: Brain: No evidence of acute infarction, hemorrhage, hydrocephalus, extra-axial collection or mass lesion/mass effect.  Vascular: Negative for hyperdense vessel  Skull: Negative  Sinuses/Orbits: Mucosal edema throughout the paranasal sinuses. Normal orbit.  Other: None  IMPRESSION: Negative CT head  Mucosal edema in the paranasal sinuses.      Sleepy but arousable and able to speak with the psychiatrist who released from involuntary commitment and recommended discharge home. Patient episode felt due to polysubstance abuse.  I agree patient does not  report any suicidal ideation or ongoing psychosis. She is going to call [facility] to pick her up.  She was instructed to discontinue Ritalin and Xanax per the psychiatrist recommendation as well as avoid cocaine and illegal drug use.   Governor RooksLord, Cailan Antonucci, MD 11/12/16 [phone]

## 2016-11-12 NOTE — ED Notes (Signed)
Pt walked out to meet her ride.  Pt was still drowsy but able to walk without any assistance safely.  Pt was given d/c instructions and verbalized understanding.

## 2016-11-12 NOTE — ED Notes (Signed)
Per MD Manson PasseyBrown and MD Zenda AlpersWebster, pt will be discharged to home upon awakening providing pt can ambulate with steady gait and in in NAD at the time.

## 2016-11-12 NOTE — ED Notes (Signed)
Res[place] care from [place]. Pt sleeping on mattresses on floor. NAD noted.

## 2016-11-12 NOTE — ED Notes (Signed)
[facility].  None found.  Bag found at nurses station, unlabeled with sweat pants and red and white polka dot shirt.  Pt states that these are her belongings.  Pt given these clothes to change into.

## 2016-11-12 NOTE — ED Notes (Signed)
Reached a family member who will come get her

## 2016-11-12 NOTE — ED Notes (Signed)
Pt is having telepsych done at this time, still sleepy but arousable, sitting in wheelchair

## 2016-11-12 NOTE — ED Notes (Signed)
Pt reports decreased coughing and less tightness in chest; requests soft drink. nad noted.

## 2019-04-14 ENCOUNTER — Other Ambulatory Visit: Payer: Self-pay

## 2019-04-14 DIAGNOSIS — Z20822 Contact with and (suspected) exposure to covid-19: Secondary | ICD-10-CM

## 2019-04-15 LAB — NOVEL CORONAVIRUS, NAA: SARS-CoV-2, NAA: NOT DETECTED

## 2020-05-07 ENCOUNTER — Other Ambulatory Visit: Payer: Self-pay

## 2020-05-07 DIAGNOSIS — Z20822 Contact with and (suspected) exposure to covid-19: Secondary | ICD-10-CM

## 2020-05-08 LAB — NOVEL CORONAVIRUS, NAA: SARS-CoV-2, NAA: NOT DETECTED

## 2020-05-08 LAB — SARS-COV-2, NAA 2 DAY TAT

## 2020-10-01 DIAGNOSIS — L0291 Cutaneous abscess, unspecified: Secondary | ICD-10-CM | POA: Diagnosis not present

## 2020-10-01 DIAGNOSIS — F319 Bipolar disorder, unspecified: Secondary | ICD-10-CM | POA: Diagnosis not present

## 2020-10-01 DIAGNOSIS — Z0001 Encounter for general adult medical examination with abnormal findings: Secondary | ICD-10-CM | POA: Diagnosis not present

## 2020-10-01 DIAGNOSIS — F902 Attention-deficit hyperactivity disorder, combined type: Secondary | ICD-10-CM | POA: Diagnosis not present

## 2022-06-12 DIAGNOSIS — F319 Bipolar disorder, unspecified: Secondary | ICD-10-CM | POA: Diagnosis not present

## 2022-06-12 DIAGNOSIS — F902 Attention-deficit hyperactivity disorder, combined type: Secondary | ICD-10-CM | POA: Diagnosis not present

## 2022-06-12 DIAGNOSIS — Z1389 Encounter for screening for other disorder: Secondary | ICD-10-CM | POA: Diagnosis not present

## 2022-07-10 DIAGNOSIS — Z1389 Encounter for screening for other disorder: Secondary | ICD-10-CM | POA: Diagnosis not present

## 2022-07-10 DIAGNOSIS — F1120 Opioid dependence, uncomplicated: Secondary | ICD-10-CM | POA: Diagnosis not present

## 2022-07-10 DIAGNOSIS — F902 Attention-deficit hyperactivity disorder, combined type: Secondary | ICD-10-CM | POA: Diagnosis not present

## 2022-07-10 DIAGNOSIS — F319 Bipolar disorder, unspecified: Secondary | ICD-10-CM | POA: Diagnosis not present

## 2022-07-10 DIAGNOSIS — Z23 Encounter for immunization: Secondary | ICD-10-CM | POA: Diagnosis not present

## 2022-07-10 DIAGNOSIS — Z0000 Encounter for general adult medical examination without abnormal findings: Secondary | ICD-10-CM | POA: Diagnosis not present

## 2022-08-17 DIAGNOSIS — R5383 Other fatigue: Secondary | ICD-10-CM | POA: Diagnosis not present

## 2022-08-17 DIAGNOSIS — Z113 Encounter for screening for infections with a predominantly sexual mode of transmission: Secondary | ICD-10-CM | POA: Diagnosis not present

## 2022-08-17 DIAGNOSIS — F319 Bipolar disorder, unspecified: Secondary | ICD-10-CM | POA: Diagnosis not present

## 2022-08-17 DIAGNOSIS — F902 Attention-deficit hyperactivity disorder, combined type: Secondary | ICD-10-CM | POA: Diagnosis not present

## 2022-08-17 DIAGNOSIS — Z131 Encounter for screening for diabetes mellitus: Secondary | ICD-10-CM | POA: Diagnosis not present

## 2022-08-17 DIAGNOSIS — Z1159 Encounter for screening for other viral diseases: Secondary | ICD-10-CM | POA: Diagnosis not present

## 2022-08-17 DIAGNOSIS — Z13220 Encounter for screening for lipoid disorders: Secondary | ICD-10-CM | POA: Diagnosis not present

## 2022-08-17 DIAGNOSIS — Z8619 Personal history of other infectious and parasitic diseases: Secondary | ICD-10-CM | POA: Diagnosis not present

## 2022-08-17 DIAGNOSIS — Z1389 Encounter for screening for other disorder: Secondary | ICD-10-CM | POA: Diagnosis not present

## 2022-09-11 ENCOUNTER — Telehealth: Payer: Self-pay

## 2022-09-11 NOTE — Telephone Encounter (Signed)
Mychart msg sent

## 2022-09-14 DIAGNOSIS — F319 Bipolar disorder, unspecified: Secondary | ICD-10-CM | POA: Diagnosis not present

## 2022-09-14 DIAGNOSIS — Z1389 Encounter for screening for other disorder: Secondary | ICD-10-CM | POA: Diagnosis not present

## 2022-09-14 DIAGNOSIS — F902 Attention-deficit hyperactivity disorder, combined type: Secondary | ICD-10-CM | POA: Diagnosis not present

## 2022-09-28 DIAGNOSIS — F902 Attention-deficit hyperactivity disorder, combined type: Secondary | ICD-10-CM | POA: Diagnosis not present

## 2022-09-28 DIAGNOSIS — F1120 Opioid dependence, uncomplicated: Secondary | ICD-10-CM | POA: Diagnosis not present

## 2022-09-28 DIAGNOSIS — Z1331 Encounter for screening for depression: Secondary | ICD-10-CM | POA: Diagnosis not present

## 2022-09-28 DIAGNOSIS — Z1389 Encounter for screening for other disorder: Secondary | ICD-10-CM | POA: Diagnosis not present

## 2022-09-28 DIAGNOSIS — F319 Bipolar disorder, unspecified: Secondary | ICD-10-CM | POA: Diagnosis not present

## 2022-10-26 DIAGNOSIS — Z1389 Encounter for screening for other disorder: Secondary | ICD-10-CM | POA: Diagnosis not present

## 2022-10-26 DIAGNOSIS — F319 Bipolar disorder, unspecified: Secondary | ICD-10-CM | POA: Diagnosis not present

## 2022-10-26 DIAGNOSIS — F1120 Opioid dependence, uncomplicated: Secondary | ICD-10-CM | POA: Diagnosis not present

## 2022-10-26 DIAGNOSIS — F902 Attention-deficit hyperactivity disorder, combined type: Secondary | ICD-10-CM | POA: Diagnosis not present

## 2022-11-09 DIAGNOSIS — F902 Attention-deficit hyperactivity disorder, combined type: Secondary | ICD-10-CM | POA: Diagnosis not present

## 2022-11-09 DIAGNOSIS — F319 Bipolar disorder, unspecified: Secondary | ICD-10-CM | POA: Diagnosis not present

## 2022-11-09 DIAGNOSIS — F1120 Opioid dependence, uncomplicated: Secondary | ICD-10-CM | POA: Diagnosis not present

## 2022-11-09 DIAGNOSIS — Z1389 Encounter for screening for other disorder: Secondary | ICD-10-CM | POA: Diagnosis not present

## 2022-12-07 DIAGNOSIS — Z8619 Personal history of other infectious and parasitic diseases: Secondary | ICD-10-CM | POA: Diagnosis not present

## 2022-12-07 DIAGNOSIS — Z0000 Encounter for general adult medical examination without abnormal findings: Secondary | ICD-10-CM | POA: Diagnosis not present

## 2022-12-07 DIAGNOSIS — F319 Bipolar disorder, unspecified: Secondary | ICD-10-CM | POA: Diagnosis not present

## 2022-12-07 DIAGNOSIS — F1120 Opioid dependence, uncomplicated: Secondary | ICD-10-CM | POA: Diagnosis not present

## 2022-12-07 DIAGNOSIS — F902 Attention-deficit hyperactivity disorder, combined type: Secondary | ICD-10-CM | POA: Diagnosis not present

## 2022-12-07 DIAGNOSIS — Z1389 Encounter for screening for other disorder: Secondary | ICD-10-CM | POA: Diagnosis not present

## 2022-12-11 ENCOUNTER — Other Ambulatory Visit: Payer: Self-pay | Admitting: Nurse Practitioner

## 2022-12-11 DIAGNOSIS — Z1231 Encounter for screening mammogram for malignant neoplasm of breast: Secondary | ICD-10-CM

## 2023-04-23 DIAGNOSIS — F319 Bipolar disorder, unspecified: Secondary | ICD-10-CM | POA: Diagnosis not present

## 2023-04-23 DIAGNOSIS — F902 Attention-deficit hyperactivity disorder, combined type: Secondary | ICD-10-CM | POA: Diagnosis not present

## 2023-04-23 DIAGNOSIS — Z1389 Encounter for screening for other disorder: Secondary | ICD-10-CM | POA: Diagnosis not present

## 2023-04-23 DIAGNOSIS — Z0000 Encounter for general adult medical examination without abnormal findings: Secondary | ICD-10-CM | POA: Diagnosis not present

## 2023-05-17 DIAGNOSIS — F319 Bipolar disorder, unspecified: Secondary | ICD-10-CM | POA: Diagnosis not present

## 2023-05-17 DIAGNOSIS — Z1389 Encounter for screening for other disorder: Secondary | ICD-10-CM | POA: Diagnosis not present

## 2023-05-17 DIAGNOSIS — R21 Rash and other nonspecific skin eruption: Secondary | ICD-10-CM | POA: Diagnosis not present

## 2023-05-17 DIAGNOSIS — M542 Cervicalgia: Secondary | ICD-10-CM | POA: Diagnosis not present

## 2023-06-14 DIAGNOSIS — Z1389 Encounter for screening for other disorder: Secondary | ICD-10-CM | POA: Diagnosis not present

## 2023-06-14 DIAGNOSIS — F902 Attention-deficit hyperactivity disorder, combined type: Secondary | ICD-10-CM | POA: Diagnosis not present

## 2023-06-14 DIAGNOSIS — M542 Cervicalgia: Secondary | ICD-10-CM | POA: Diagnosis not present

## 2023-07-12 DIAGNOSIS — Z1389 Encounter for screening for other disorder: Secondary | ICD-10-CM | POA: Diagnosis not present

## 2023-07-12 DIAGNOSIS — M542 Cervicalgia: Secondary | ICD-10-CM | POA: Diagnosis not present

## 2023-07-12 DIAGNOSIS — F902 Attention-deficit hyperactivity disorder, combined type: Secondary | ICD-10-CM | POA: Diagnosis not present

## 2023-08-16 DIAGNOSIS — M542 Cervicalgia: Secondary | ICD-10-CM | POA: Diagnosis not present

## 2023-08-16 DIAGNOSIS — F902 Attention-deficit hyperactivity disorder, combined type: Secondary | ICD-10-CM | POA: Diagnosis not present

## 2023-08-16 DIAGNOSIS — Z1389 Encounter for screening for other disorder: Secondary | ICD-10-CM | POA: Diagnosis not present

## 2023-08-16 DIAGNOSIS — F319 Bipolar disorder, unspecified: Secondary | ICD-10-CM | POA: Diagnosis not present

## 2023-08-31 ENCOUNTER — Other Ambulatory Visit: Payer: Self-pay | Admitting: Nurse Practitioner

## 2023-08-31 ENCOUNTER — Ambulatory Visit
Admission: RE | Admit: 2023-08-31 | Discharge: 2023-08-31 | Disposition: A | Discharge status: Home / Self Care | Payer: Medicaid Other | Source: Ambulatory Visit | Attending: Nurse Practitioner | Admitting: Nurse Practitioner

## 2023-08-31 DIAGNOSIS — N6310 Unspecified lump in the right breast, unspecified quadrant: Secondary | ICD-10-CM

## 2023-08-31 DIAGNOSIS — Z1231 Encounter for screening mammogram for malignant neoplasm of breast: Secondary | ICD-10-CM | POA: Insufficient documentation

## 2023-09-04 NOTE — Progress Notes (Deleted)
 Referring Physician:  Edwena Felty, MD [address] [place],  [place] [address]  Primary Physician:  Edwena Felty, MD  History of Present Illness: 09/04/2023 Ms. Claryssa Sandner is here today with a chief complaint of ***  Neck pain? Arm pain?  Duration: *** Location: *** Quality: *** Severity: ***  Precipitating: aggravated by *** Modifying factors: made better by *** Weakness: none Timing: *** Bowel/Bladder Dysfunction: none  Conservative measures:  Physical therapy: *** has not participated in? Multimodal medical therapy including regular antiinflammatories: *** advil Injections: *** no epidural steroid injections?  Past Surgery: ***  LYNORE COSCIA has ***no symptoms of cervical myelopathy.  The symptoms are causing a significant impact on the patient's life.   Review of Systems:  A 10 point review of systems is negative, except for the pertinent positives and negatives detailed in the HPI.  Past Medical History: Past Medical History:  Diagnosis Date   Anemia    Arthritis    Asthma    Bipolar 1 disorder (HCC)    Depression    GERD (gastroesophageal reflux disease)    Headache(784.0)    Mental disorder    Neuromuscular disorder (HCC)     Past Surgical History: Past Surgical History:  Procedure Laterality Date   BILATERAL CARPAL TUNNEL RELEASE     CESAREAN SECTION  2009   DILATION AND CURETTAGE OF UTERUS     miscarriage   LAPAROSCOPY  2011   TONSILLECTOMY      Allergies: Allergies as of 09/05/2023 - Review Complete 11/11/2016  Allergen Reaction Noted   Buspar [buspirone]  09/11/2012   Lexapro [escitalopram oxalate] Diarrhea 09/11/2012   Nubain [nalbuphine hcl]  09/11/2012   Tylenol [acetaminophen] Diarrhea 09/11/2012   Vicodin [hydrocodone-acetaminophen]  09/11/2012   Zanaflex [tizanidine hcl]  09/11/2012    Medications: Outpatient Encounter Medications as of 09/05/2023  Medication Sig   buprenorphine-naloxone (SUBOXONE)  8-2 MG SUBL SL tablet Place 1 tablet under the tongue daily.   gabapentin (NEURONTIN) 100 MG capsule Take 100 mg by mouth 3 (three) times daily.   hydrocortisone valerate cream (WESTCORT) 0.2 % Apply 1 application topically 2 (two) times daily.   hydrOXYzine (ATARAX/VISTARIL) 50 MG tablet Take 1 tablet (50 mg total) by mouth every 4 (four) hours as needed for anxiety. For anxiety/sleep   ibuprofen (ADVIL,MOTRIN) 800 MG tablet Take 1 tablet (800 mg total) by mouth every 8 (eight) hours as needed for pain (with food). For moderate pain   Multiple Vitamins-Minerals (MULTIVITAMIN WITH MINERALS) tablet Take 1 tablet by mouth daily. For vitamin deficiency   predniSONE (STERAPRED UNI-PAK 48 TAB) 10 MG (48) TBPK tablet Take by mouth daily. Use as directed in a 12 day taper PredPak   promethazine (PHENERGAN) 12.5 MG tablet Take 1 tablet (12.5 mg total) by mouth every 6 (six) hours as needed for nausea or vomiting.   sulfamethoxazole-trimethoprim (BACTRIM DS,SEPTRA DS) 800-160 MG per tablet Take 1 tablet by mouth 2 (two) times daily.   vitamin B-12 100 MCG tablet Take 1 tablet (100 mcg total) by mouth daily. For Cyanocobalamin deficiency   No facility-administered encounter medications on file as of 09/05/2023.    Social History: Social History   Tobacco Use   Smoking status: Every Day    Current packs/day: 1.00    Types: Cigarettes   Smokeless tobacco: Never  Substance Use Topics   Alcohol use: Yes    Alcohol/week: 3.0 standard drinks of alcohol    Types: 3 Cans of beer per week  Comment: 3 24 oz beers daily   Drug use: Yes    Types: Cocaine, Marijuana, Heroin, Oxycodone    Comment: ritalin snorting    Family Medical History: Family History  Problem Relation Age of Onset   Diabetes Father    Breast cancer Maternal Grandmother 15 - 49   Cancer Maternal Grandmother        breast   Hypertension Maternal Grandmother    Diabetes Maternal Grandfather    Heart disease Paternal Grandfather      Physical Examination: @VITALWITHPAIN @  General: Patient is well developed, well nourished, calm, collected, and in no apparent distress. Attention to examination is appropriate.  Psychiatric: Patient is non-anxious.  Head:  Pupils equal, round, and reactive to light.  ENT:  Oral mucosa appears well hydrated.  Neck:   Supple.  ***Full range of motion.  Respiratory: Patient is breathing without any difficulty.  Extremities: No edema.  Vascular: Palpable dorsal pedal pulses.  Skin:   On exposed skin, there are no abnormal skin lesions.  NEUROLOGICAL:     Awake, alert, oriented to person, place, and time.  Speech is clear and fluent. Fund of knowledge is appropriate.   Cranial Nerves: Pupils equal round and reactive to light.  Facial tone is symmetric.  Facial sensation is symmetric.  ROM of spine: ***full.  Palpation of spine: ***non tender.    Strength: Side Biceps Triceps Deltoid Interossei Grip Wrist Ext. Wrist Flex.  R 5 5 5 5 5 5 5   L 5 5 5 5 5 5 5    Side Iliopsoas Quads Hamstring PF DF EHL  R 5 5 5 5 5 5   L 5 5 5 5 5 5    Reflexes are ***2+ and symmetric at the biceps, triceps, brachioradialis, patella and achilles.   Hoffman's is absent.  Clonus is not present.  Toes are down-going.  Bilateral upper and lower extremity sensation is intact to light touch.    Gait is normal.   No difficulty with tandem gait.   No evidence of dysmetria noted.  Medical Decision Making  Imaging: ***  I have personally reviewed the images and agree with the above interpretation.  Assessment and Plan: Ms. Enslow is a pleasant 43 y.o. female with ***    Thank you for involving me in the care of this patient.   I spent a total of *** minutes in both face-to-face and non-face-to-face activities for this visit on the date of this encounter.   Joan Flores, PA-C Dept. of Neurosurgery

## 2023-09-05 ENCOUNTER — Ambulatory Visit: Payer: Medicaid Other | Admitting: Physician Assistant

## 2023-09-13 DIAGNOSIS — Z1389 Encounter for screening for other disorder: Secondary | ICD-10-CM | POA: Diagnosis not present

## 2023-09-13 DIAGNOSIS — F319 Bipolar disorder, unspecified: Secondary | ICD-10-CM | POA: Diagnosis not present

## 2023-09-13 DIAGNOSIS — F902 Attention-deficit hyperactivity disorder, combined type: Secondary | ICD-10-CM | POA: Diagnosis not present

## 2023-10-01 ENCOUNTER — Encounter: Payer: Self-pay | Admitting: Physician Assistant

## 2023-10-11 DIAGNOSIS — F319 Bipolar disorder, unspecified: Secondary | ICD-10-CM | POA: Diagnosis not present

## 2023-10-11 DIAGNOSIS — F902 Attention-deficit hyperactivity disorder, combined type: Secondary | ICD-10-CM | POA: Diagnosis not present

## 2023-10-11 DIAGNOSIS — Z1331 Encounter for screening for depression: Secondary | ICD-10-CM | POA: Diagnosis not present

## 2023-10-11 DIAGNOSIS — Z1389 Encounter for screening for other disorder: Secondary | ICD-10-CM | POA: Diagnosis not present

## 2023-11-01 DIAGNOSIS — R5383 Other fatigue: Secondary | ICD-10-CM | POA: Diagnosis not present

## 2023-11-01 DIAGNOSIS — F902 Attention-deficit hyperactivity disorder, combined type: Secondary | ICD-10-CM | POA: Diagnosis not present

## 2023-11-01 DIAGNOSIS — Z8619 Personal history of other infectious and parasitic diseases: Secondary | ICD-10-CM | POA: Diagnosis not present

## 2023-11-01 DIAGNOSIS — Z113 Encounter for screening for infections with a predominantly sexual mode of transmission: Secondary | ICD-10-CM | POA: Diagnosis not present

## 2023-11-01 DIAGNOSIS — N950 Postmenopausal bleeding: Secondary | ICD-10-CM | POA: Diagnosis not present

## 2023-11-01 DIAGNOSIS — F319 Bipolar disorder, unspecified: Secondary | ICD-10-CM | POA: Diagnosis not present

## 2023-11-29 DIAGNOSIS — Z1389 Encounter for screening for other disorder: Secondary | ICD-10-CM | POA: Diagnosis not present

## 2023-11-29 DIAGNOSIS — F902 Attention-deficit hyperactivity disorder, combined type: Secondary | ICD-10-CM | POA: Diagnosis not present

## 2023-11-29 DIAGNOSIS — F319 Bipolar disorder, unspecified: Secondary | ICD-10-CM | POA: Diagnosis not present

## 2023-12-24 ENCOUNTER — Telehealth: Payer: Self-pay | Admitting: *Deleted

## 2023-12-24 NOTE — Progress Notes (Unsigned)
 Complex Care Management Note Care Guide Note  12/24/2023 Name: Crystal [name] MRN: [id number] DOB: Sep 08, 1980   Complex Care Management Outreach Attempts: An unsuccessful telephone outreach was attempted today to offer the patient information about available complex care management services.  Follow Up Plan:  Additional outreach attempts will be made to offer the patient complex care management information and services.   Encounter Outcome:  No Answer  Harlene Satterfield  [facility]  [facility], [facility] Guide  Direct Dial: [phone]  Fax [phone]

## 2023-12-25 NOTE — Progress Notes (Unsigned)
 Complex Care Management Note Care Guide Note  12/25/2023 Name: DRINA JOBST MRN: [id number] DOB: July 13, 1980   Complex Care Management Outreach Attempts: A second unsuccessful outreach was attempted today to offer the patient with information about available complex care management services.  Follow Up Plan:  Additional outreach attempts will be made to offer the patient complex care management information and services.   Encounter Outcome:  No Answer  Harlene Satterfield  [facility]  [facility], [facility] Guide  Direct Dial: [phone]  Fax [phone]

## 2023-12-26 ENCOUNTER — Telehealth: Payer: Self-pay | Admitting: *Deleted

## 2023-12-26 DIAGNOSIS — F1920 Other psychoactive substance dependence, uncomplicated: Secondary | ICD-10-CM

## 2023-12-26 DIAGNOSIS — B182 Chronic viral hepatitis C: Secondary | ICD-10-CM

## 2023-12-26 NOTE — Progress Notes (Signed)
 Complex Care Management Note Care Guide Note  12/26/2023 Name: FERMINA MISHKIN MRN: [id number] DOB: [birth year]   Complex Care Management Outreach Attempts: A third unsuccessful outreach was attempted today to offer the patient with information about available complex care management services.  Follow Up Plan:  No further outreach attempts will be made at this time. We have been unable to contact the patient to offer or enroll patient in complex care management services.  Encounter Outcome:  No Answer  Harlene Satterfield  [facility]  [facility], [facility] Guide  Direct Dial: [phone]  Fax [phone]

## 2024-01-24 DIAGNOSIS — F902 Attention-deficit hyperactivity disorder, combined type: Secondary | ICD-10-CM | POA: Diagnosis not present

## 2024-01-24 DIAGNOSIS — Z1389 Encounter for screening for other disorder: Secondary | ICD-10-CM | POA: Diagnosis not present

## 2024-01-24 DIAGNOSIS — F319 Bipolar disorder, unspecified: Secondary | ICD-10-CM | POA: Diagnosis not present

## 2024-03-27 DIAGNOSIS — K047 Periapical abscess without sinus: Secondary | ICD-10-CM | POA: Diagnosis not present

## 2024-03-27 DIAGNOSIS — G259 Extrapyramidal and movement disorder, unspecified: Secondary | ICD-10-CM | POA: Diagnosis not present

## 2024-03-27 DIAGNOSIS — Z20822 Contact with and (suspected) exposure to covid-19: Secondary | ICD-10-CM | POA: Diagnosis not present

## 2024-03-27 DIAGNOSIS — F902 Attention-deficit hyperactivity disorder, combined type: Secondary | ICD-10-CM | POA: Diagnosis not present

## 2024-03-27 DIAGNOSIS — Z0000 Encounter for general adult medical examination without abnormal findings: Secondary | ICD-10-CM | POA: Diagnosis not present

## 2024-03-27 DIAGNOSIS — Z1389 Encounter for screening for other disorder: Secondary | ICD-10-CM | POA: Diagnosis not present

## 2024-03-27 DIAGNOSIS — N950 Postmenopausal bleeding: Secondary | ICD-10-CM | POA: Diagnosis not present

## 2024-03-31 ENCOUNTER — Other Ambulatory Visit: Payer: Self-pay | Admitting: Nurse Practitioner

## 2024-03-31 DIAGNOSIS — Z1231 Encounter for screening mammogram for malignant neoplasm of breast: Secondary | ICD-10-CM

## 2024-04-16 ENCOUNTER — Inpatient Hospital Stay
Admission: RE | Admit: 2024-04-16 | Discharge: 2024-04-16 | Disposition: A | Payer: Self-pay | Source: Ambulatory Visit | Attending: Nurse Practitioner

## 2024-04-16 ENCOUNTER — Other Ambulatory Visit: Payer: Self-pay | Admitting: *Deleted

## 2024-04-16 ENCOUNTER — Inpatient Hospital Stay
Admission: RE | Admit: 2024-04-16 | Discharge: 2024-04-16 | Disposition: A | Discharge status: Home / Self Care | Payer: Self-pay | Source: Ambulatory Visit | Attending: Nurse Practitioner | Admitting: Nurse Practitioner

## 2024-04-16 DIAGNOSIS — Z1231 Encounter for screening mammogram for malignant neoplasm of breast: Secondary | ICD-10-CM

## 2024-04-17 ENCOUNTER — Encounter

## 2024-04-17 ENCOUNTER — Other Ambulatory Visit

## 2024-04-23 ENCOUNTER — Ambulatory Visit
Admission: RE | Admit: 2024-04-23 | Discharge: 2024-04-23 | Disposition: A | Discharge status: Home / Self Care | Source: Ambulatory Visit | Attending: Nurse Practitioner | Admitting: Nurse Practitioner

## 2024-04-23 DIAGNOSIS — N6320 Unspecified lump in the left breast, unspecified quadrant: Secondary | ICD-10-CM | POA: Insufficient documentation

## 2024-04-23 DIAGNOSIS — N6310 Unspecified lump in the right breast, unspecified quadrant: Secondary | ICD-10-CM | POA: Insufficient documentation

## 2024-04-23 DIAGNOSIS — R92343 Mammographic extreme density, bilateral breasts: Secondary | ICD-10-CM | POA: Diagnosis not present

## 2024-04-23 DIAGNOSIS — N6311 Unspecified lump in the right breast, upper outer quadrant: Secondary | ICD-10-CM | POA: Diagnosis not present

## 2024-04-24 ENCOUNTER — Other Ambulatory Visit: Payer: Self-pay | Admitting: Nurse Practitioner

## 2024-04-24 DIAGNOSIS — R928 Other abnormal and inconclusive findings on diagnostic imaging of breast: Secondary | ICD-10-CM

## 2024-05-22 DIAGNOSIS — N950 Postmenopausal bleeding: Secondary | ICD-10-CM | POA: Diagnosis not present

## 2024-05-22 DIAGNOSIS — N760 Acute vaginitis: Secondary | ICD-10-CM | POA: Diagnosis not present

## 2024-05-22 DIAGNOSIS — R928 Other abnormal and inconclusive findings on diagnostic imaging of breast: Secondary | ICD-10-CM | POA: Diagnosis not present

## 2024-05-22 DIAGNOSIS — K047 Periapical abscess without sinus: Secondary | ICD-10-CM | POA: Diagnosis not present

## 2024-05-22 DIAGNOSIS — F319 Bipolar disorder, unspecified: Secondary | ICD-10-CM | POA: Diagnosis not present

## 2024-05-22 DIAGNOSIS — F902 Attention-deficit hyperactivity disorder, combined type: Secondary | ICD-10-CM | POA: Diagnosis not present

## 2024-05-22 DIAGNOSIS — Z1389 Encounter for screening for other disorder: Secondary | ICD-10-CM | POA: Diagnosis not present
